# Patient Record
Sex: Female | Born: 1965 | Race: Black or African American | Hispanic: No | Smoking: Never smoker
Health system: Southern US, Community
[De-identification: ages and names within clinical notes are randomized; demographics above are authoritative.]

## PROBLEM LIST (undated history)

## (undated) DIAGNOSIS — J302 Other seasonal allergic rhinitis: Secondary | ICD-10-CM

## (undated) DIAGNOSIS — R87810 Cervical high risk human papillomavirus (HPV) DNA test positive: Secondary | ICD-10-CM

## (undated) DIAGNOSIS — E559 Vitamin D deficiency, unspecified: Secondary | ICD-10-CM

## (undated) DIAGNOSIS — Z9289 Personal history of other medical treatment: Secondary | ICD-10-CM

## (undated) DIAGNOSIS — R87610 Atypical squamous cells of undetermined significance on cytologic smear of cervix (ASC-US): Secondary | ICD-10-CM

## (undated) DIAGNOSIS — N915 Oligomenorrhea, unspecified: Secondary | ICD-10-CM

## (undated) DIAGNOSIS — R61 Generalized hyperhidrosis: Secondary | ICD-10-CM

## (undated) DIAGNOSIS — N951 Menopausal and female climacteric states: Secondary | ICD-10-CM

## (undated) DIAGNOSIS — T7840XA Allergy, unspecified, initial encounter: Secondary | ICD-10-CM

## (undated) DIAGNOSIS — Z3040 Encounter for surveillance of contraceptives, unspecified: Secondary | ICD-10-CM

## (undated) HISTORY — DX: Atypical squamous cells of undetermined significance on cytologic smear of cervix (ASC-US): R87.610

## (undated) HISTORY — DX: Oligomenorrhea, unspecified: N91.5

## (undated) HISTORY — DX: Generalized hyperhidrosis: R61

## (undated) HISTORY — DX: Vitamin D deficiency, unspecified: E55.9

## (undated) HISTORY — DX: Menopausal and female climacteric states: N95.1

## (undated) HISTORY — DX: Cervical high risk human papillomavirus (HPV) DNA test positive: R87.810

## (undated) HISTORY — PX: COLPOSCOPY W/ BIOPSY / CURETTAGE: SUR283

## (undated) HISTORY — DX: Personal history of other medical treatment: Z92.89

## (undated) HISTORY — DX: Other seasonal allergic rhinitis: J30.2

## (undated) HISTORY — DX: Allergy, unspecified, initial encounter: T78.40XA

## (undated) HISTORY — DX: Encounter for surveillance of contraceptives, unspecified: Z30.40

---

## 2004-08-25 ENCOUNTER — Ambulatory Visit (HOSPITAL_COMMUNITY): Admission: RE | Admit: 2004-08-25 | Discharge: 2004-08-25 | Payer: Self-pay | Admitting: Infectious Diseases

## 2006-02-26 ENCOUNTER — Ambulatory Visit: Payer: Self-pay | Admitting: Family Medicine

## 2006-03-03 ENCOUNTER — Ambulatory Visit: Payer: Self-pay | Admitting: Family Medicine

## 2010-04-29 ENCOUNTER — Ambulatory Visit: Payer: Self-pay | Admitting: Family Medicine

## 2012-06-14 ENCOUNTER — Ambulatory Visit: Payer: Self-pay | Admitting: Family Medicine

## 2012-06-14 LAB — HM MAMMOGRAPHY: HM Mammogram: NORMAL

## 2012-06-16 ENCOUNTER — Ambulatory Visit: Payer: Self-pay | Admitting: Family Medicine

## 2014-01-09 LAB — LIPID PANEL
Cholesterol: 187 mg/dL (ref 0–200)
HDL: 64 mg/dL (ref 35–70)
LDL Cholesterol: 90 mg/dL
Triglycerides: 166 mg/dL — AB (ref 40–160)

## 2014-06-25 LAB — HM PAP SMEAR: HM Pap smear: HIGH

## 2015-06-22 ENCOUNTER — Encounter: Payer: Self-pay | Admitting: Family Medicine

## 2015-06-22 DIAGNOSIS — J309 Allergic rhinitis, unspecified: Secondary | ICD-10-CM | POA: Insufficient documentation

## 2015-06-22 DIAGNOSIS — R61 Generalized hyperhidrosis: Secondary | ICD-10-CM | POA: Insufficient documentation

## 2015-06-22 DIAGNOSIS — N915 Oligomenorrhea, unspecified: Secondary | ICD-10-CM | POA: Insufficient documentation

## 2015-06-22 DIAGNOSIS — Z8619 Personal history of other infectious and parasitic diseases: Secondary | ICD-10-CM | POA: Insufficient documentation

## 2015-06-22 DIAGNOSIS — E559 Vitamin D deficiency, unspecified: Secondary | ICD-10-CM | POA: Insufficient documentation

## 2015-06-22 DIAGNOSIS — R7611 Nonspecific reaction to tuberculin skin test without active tuberculosis: Secondary | ICD-10-CM | POA: Insufficient documentation

## 2015-06-28 ENCOUNTER — Encounter: Payer: Self-pay | Admitting: Family Medicine

## 2015-06-28 ENCOUNTER — Ambulatory Visit (INDEPENDENT_AMBULATORY_CARE_PROVIDER_SITE_OTHER): Payer: BC Managed Care – PPO | Admitting: Family Medicine

## 2015-06-28 VITALS — BP 128/86 | HR 82 | Temp 97.9°F | Resp 18 | Ht 65.0 in | Wt 161.8 lb

## 2015-06-28 DIAGNOSIS — Z124 Encounter for screening for malignant neoplasm of cervix: Secondary | ICD-10-CM | POA: Diagnosis not present

## 2015-06-28 DIAGNOSIS — E559 Vitamin D deficiency, unspecified: Secondary | ICD-10-CM | POA: Diagnosis not present

## 2015-06-28 DIAGNOSIS — Z3040 Encounter for surveillance of contraceptives, unspecified: Secondary | ICD-10-CM | POA: Diagnosis not present

## 2015-06-28 DIAGNOSIS — Z114 Encounter for screening for human immunodeficiency virus [HIV]: Secondary | ICD-10-CM | POA: Diagnosis not present

## 2015-06-28 DIAGNOSIS — Z131 Encounter for screening for diabetes mellitus: Secondary | ICD-10-CM

## 2015-06-28 DIAGNOSIS — B977 Papillomavirus as the cause of diseases classified elsewhere: Secondary | ICD-10-CM

## 2015-06-28 DIAGNOSIS — Z0000 Encounter for general adult medical examination without abnormal findings: Secondary | ICD-10-CM | POA: Diagnosis not present

## 2015-06-28 DIAGNOSIS — Z1239 Encounter for other screening for malignant neoplasm of breast: Secondary | ICD-10-CM

## 2015-06-28 DIAGNOSIS — N951 Menopausal and female climacteric states: Secondary | ICD-10-CM

## 2015-06-28 DIAGNOSIS — Z13220 Encounter for screening for lipoid disorders: Secondary | ICD-10-CM | POA: Diagnosis not present

## 2015-06-28 DIAGNOSIS — Z01419 Encounter for gynecological examination (general) (routine) without abnormal findings: Secondary | ICD-10-CM

## 2015-06-28 DIAGNOSIS — Z23 Encounter for immunization: Secondary | ICD-10-CM

## 2015-06-28 MED ORDER — DESOGESTREL-ETHINYL ESTRADIOL 0.15-0.02/0.01 MG (21/5) PO TABS: 1.0000 | ORAL_TABLET | Freq: Every day | ORAL | Status: DC

## 2015-06-28 NOTE — Progress Notes (Signed)
Name: Amber Hardin   MRN: [id number]    DOB: 1966-06-10   Date:06/28/2015       Progress Note  Subjective  Chief Complaint  Chief Complaint  Patient presents with  . Annual Exam    HPI  Well Woman Exam: skipping cycles even on ocp but she does not want stop it at this time, not sexually active for the past 8 months, denies vaginal symptoms, no breast complaints.    Patient Active Problem List   Diagnosis Date Noted  . Allergic rhinitis 06/22/2015  . 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 Nonspecific reaction to tuberculin skin test 06/22/2015  . Infrequent menses 06/22/2015  . Night sweat 06/22/2015  . Vitamin D deficiency 06/22/2015    History reviewed. No pertinent past surgical history.  Family History  Problem Relation Age of Onset  . Cancer Mother 73    Breast  . Diabetes Mother   . Heart disease Father   . Heart attack Father 69  . Diabetes Sister     Middle Sister  . Hypertension Sister     Older Sister  . Thyroid disease Sister     Older Sister    Social History   Social History  . Marital Status: Divorced    Spouse Name: N/A  . Number of Children: N/A  . Years of Education: N/A   Occupational History  . Not on file.   Social History Main Topics  . Smoking status: Never Smoker   . Smokeless tobacco: Never Used  . Alcohol Use: 0.0 oz/week    0 Standard drinks or equivalent per week     Comment: socially  . Drug Use: No  . Sexual Activity: Not Currently   Other Topics Concern  . Not on file   Social History Narrative     Current outpatient prescriptions:  .  desogestrel-ethinyl estradiol (KARIVA) 0.15-0.02/0.01 MG (21/5) tablet, Take 1 tablet by mouth daily., Disp: 1 Package, Rfl: 12 .  fexofenadine (ALLEGRA) 180 MG tablet, Take 1 tablet by mouth daily., Disp: , Rfl:  .  Multiple Vitamins-Minerals (MULTIVITAL) CHEW, Chew 1 tablet by mouth daily., Disp: , Rfl:  .  triamcinolone (NASACORT ALLERGY 24HR) 55 MCG/ACT AERO  nasal inhaler, Place 2 sprays into the nose every morning., Disp: , Rfl:   Allergies  Allergen Reactions  . Latex      ROS  Constitutional: Negative for fever or weight change.  Respiratory: Negative for cough and shortness of breath.   Cardiovascular: Negative for chest pain or palpitations.  Gastrointestinal: Negative for abdominal pain, no bowel changes.  Musculoskeletal: Negative for gait problem or joint swelling.  Skin: Negative for rash.  Neurological: Negative for dizziness or headache.  No other specific complaints in a complete review of systems (except as listed in HPI above).  Objective  Filed Vitals:   06/28/15 0846  BP: 128/86  Pulse: 82  Temp: 97.9 F (36.6 C)  TempSrc: Oral  Resp: 18  Height: 5\' 5"  (1.651 m)  Weight: 161 lb 12.8 oz (73.392 kg)  SpO2: 95%    Body mass index is 26.92 kg/(m^2).  Physical Exam  Constitutional: Patient appears well-developed and well-nourished. No distress.  HENT: Head: Normocephalic and atraumatic. Ears: B TMs ok, no erythema or effusion; Nose: Nose normal. Mouth/Throat: Oropharynx is clear and moist. No oropharyngeal exudate.  Eyes: Conjunctivae and EOM are normal. Pupils are equal, round, and reactive to light. No scleral icterus.  Neck: Normal range of motion. Neck supple.  No JVD present. No thyromegaly present.  Cardiovascular: Normal rate, regular rhythm and normal heart sounds.  No murmur heard. No BLE edema. Pulmonary/Chest: Effort normal and breath sounds normal. No respiratory distress. Abdominal: Soft. Bowel sounds are normal, no distension. There is no tenderness. no masses Breast: no lumps or masses, no nipple discharge or rashes FEMALE GENITALIA:  External genitalia normal External urethra normal Vaginal vault normal without discharge or lesions Cervix normal without discharge or lesions Bimanual exam normal without masses RECTAL: no rectal masses or hemorrhoids Musculoskeletal: Normal range of motion, no  joint effusions. No gross deformities Neurological: he is alert and oriented to person, place, and time. No cranial nerve deficit. Coordination, balance, strength, speech and gait are normal.  Skin: Skin is warm and dry. No rash noted. No erythema.  Psychiatric: Patient has a normal mood and affect. behavior is normal. Judgment and thought content normal.   PHQ2/9: Depression screen PHQ 2/9 06/28/2015  Decreased Interest 0  Down, Depressed, Hopeless 0  PHQ - 2 Score 0    Fall Risk: Fall Risk  06/28/2015  Falls in the past year? No     Assessment & Plan  1. Well woman exam Discussed importance of 150 minutes of physical activity weekly, eat two servings of fish weekly, eat one serving of tree nuts ( cashews, pistachios, pecans, almonds.Marland Kitchen) every other day, eat 6 servings of fruit/vegetables daily and drink plenty of water and avoid sweet beverages.   2. HPV (human papilloma virus) infection  - Pap IG, CT/NG NAA, and HPV (high risk)  3. Vitamin D deficiency Taking otc medication -vitamin D  4. Lipid screening  - Lipid panel  5. Screening for diabetes mellitus (DM)  - Hemoglobin A1c  6. Perimenopause Discussed symptoms, suggested stopping ocp but she wants to continue for now  7. Encounter for surveillance of contraceptives  - desogestrel-ethinyl estradiol (KARIVA) 0.15-0.02/0.01 MG (21/5) tablet; Take 1 tablet by mouth daily.  Dispense: 1 Package; Refill: 12  8. Encounter for screening for HIV  - HIV antibody  9. Breast cancer screening  - MM Digital Screening; Future   10. Needs flu shot  - Flu Vaccine QUAD 36+ mos PF IM (Fluarix Quad PF)

## 2015-06-29 LAB — HEMOGLOBIN A1C
Est. average glucose Bld gHb Est-mCnc: 126 mg/dL
Hgb A1c MFr Bld: 6 % — ABNORMAL HIGH (ref 4.8–5.6)

## 2015-06-29 LAB — VITAMIN D 25 HYDROXY (VIT D DEFICIENCY, FRACTURES): Vit D, 25-Hydroxy: 23.2 ng/mL — ABNORMAL LOW (ref 30.0–100.0)

## 2015-06-29 LAB — HIV ANTIBODY (ROUTINE TESTING W REFLEX): HIV Screen 4th Generation wRfx: NONREACTIVE

## 2015-06-29 LAB — LIPID PANEL
Chol/HDL Ratio: 2.8 ratio units (ref 0.0–4.4)
Cholesterol, Total: 179 mg/dL (ref 100–199)
HDL: 64 mg/dL (ref >39)
LDL Calculated: 75 mg/dL (ref 0–99)
Triglycerides: 202 mg/dL — ABNORMAL HIGH (ref 0–149)
VLDL Cholesterol Cal: 40 mg/dL (ref 5–40)

## 2015-07-01 NOTE — Progress Notes (Signed)
Patient notified

## 2015-07-03 LAB — PAP IG, CT-NG NAA, HPV HIGH-RISK
HPV, HIGH-RISK: NEGATIVE
PAP Smear Comment: 0

## 2015-07-03 NOTE — Progress Notes (Signed)
Patient Pap is getting sent through fax, not sure why it did not cross over.

## 2015-07-04 NOTE — Progress Notes (Signed)
Pt.notified

## 2016-06-29 ENCOUNTER — Encounter: Payer: Self-pay | Admitting: Family Medicine

## 2016-06-29 ENCOUNTER — Other Ambulatory Visit: Payer: Self-pay | Admitting: Family Medicine

## 2016-06-29 ENCOUNTER — Ambulatory Visit (INDEPENDENT_AMBULATORY_CARE_PROVIDER_SITE_OTHER): Payer: BC Managed Care – PPO | Admitting: Family Medicine

## 2016-06-29 VITALS — BP 112/70 | HR 79 | Temp 98.8°F | Resp 18 | Ht 65.0 in | Wt 158.5 lb

## 2016-06-29 DIAGNOSIS — Z124 Encounter for screening for malignant neoplasm of cervix: Secondary | ICD-10-CM

## 2016-06-29 DIAGNOSIS — R350 Frequency of micturition: Secondary | ICD-10-CM | POA: Diagnosis not present

## 2016-06-29 DIAGNOSIS — Z0000 Encounter for general adult medical examination without abnormal findings: Secondary | ICD-10-CM

## 2016-06-29 DIAGNOSIS — E559 Vitamin D deficiency, unspecified: Secondary | ICD-10-CM

## 2016-06-29 DIAGNOSIS — Z131 Encounter for screening for diabetes mellitus: Secondary | ICD-10-CM

## 2016-06-29 DIAGNOSIS — B977 Papillomavirus as the cause of diseases classified elsewhere: Secondary | ICD-10-CM | POA: Diagnosis not present

## 2016-06-29 DIAGNOSIS — Z3040 Encounter for surveillance of contraceptives, unspecified: Secondary | ICD-10-CM

## 2016-06-29 DIAGNOSIS — Z13220 Encounter for screening for lipoid disorders: Secondary | ICD-10-CM

## 2016-06-29 DIAGNOSIS — Z01419 Encounter for gynecological examination (general) (routine) without abnormal findings: Secondary | ICD-10-CM

## 2016-06-29 DIAGNOSIS — Z1239 Encounter for other screening for malignant neoplasm of breast: Secondary | ICD-10-CM | POA: Diagnosis not present

## 2016-06-29 LAB — LIPID PANEL
Cholesterol: 177 mg/dL (ref 125–200)
HDL: 63 mg/dL (ref >=46)
LDL Cholesterol: 91 mg/dL (ref <130)
Total CHOL/HDL Ratio: 2.8 Ratio (ref <=5.0)
Triglycerides: 115 mg/dL (ref <150)
VLDL: 23 mg/dL (ref <30)

## 2016-06-29 LAB — COMPLETE METABOLIC PANEL WITH GFR
ALT: 12 U/L (ref 6–29)
AST: 13 U/L (ref 10–35)
Albumin: 4.1 g/dL (ref 3.6–5.1)
Alkaline Phosphatase: 85 U/L (ref 33–115)
BUN: 8 mg/dL (ref 7–25)
CO2: 27 mmol/L (ref 20–31)
Calcium: 10 mg/dL (ref 8.6–10.2)
Chloride: 101 mmol/L (ref 98–110)
Creat: 0.77 mg/dL (ref 0.50–1.10)
GFR, Est African American: >89 mL/min (ref >=60)
GFR, Est Non African American: >89 mL/min (ref >=60)
Glucose, Bld: 101 mg/dL — ABNORMAL HIGH (ref 65–99)
Potassium: 5.1 mmol/L (ref 3.5–5.3)
Sodium: 136 mmol/L (ref 135–146)
Total Bilirubin: 1.1 mg/dL (ref 0.2–1.2)
Total Protein: 7.2 g/dL (ref 6.1–8.1)

## 2016-06-29 NOTE — Progress Notes (Signed)
Name: Amber Hardin   MRN: [id number]    DOB: 09/06/1966   Date:06/29/2016       Progress Note  Subjective  Chief Complaint  Chief Complaint  Patient presents with  . Annual Exam    HPI  Well Woman: she has not been sexually in the past 8 months, no vaginal discharge, no breast problems. She is no longer on ocp's. States she will use condoms if needed for contraception . Hx of HPV positive, but last pap was normal , negative HPV.  Urinary frequency: she went to Urgent care at the end of June with supra - pubic discomfort, pressure and urinary frequency. She was diagnosed with an UTI and treated with 10 days of Septra, she finished the antibiotics but continues to have urinary frequency and intermittent pressure. She still has nocturia, and also has the urge to use restroom, at times has to pull over to urinate.   Patient Active Problem List   Diagnosis Date Noted  . Allergic rhinitis 06/22/2015  . 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 Nonspecific reaction to tuberculin skin test 06/22/2015  . Vitamin D deficiency 06/22/2015    History reviewed. No pertinent surgical history.  Family History  Problem Relation Age of Onset  . Cancer Mother [id number]    Breast  . Diabetes Mother   . Heart disease Father   . Heart attack Father [id number]  . Diabetes Sister     Middle Sister  . Hypertension Sister     Older Sister  . Thyroid disease Sister     Older Sister    Social History   Social History  . Marital status: Divorced    Spouse name: N/A  . Number of children: N/A  . Years of education: N/A   Occupational History  . Not on file.   Social History Main Topics  . Smoking status: Never Smoker  . Smokeless tobacco: Never Used  . Alcohol use 0.0 oz/week     Comment: socially  . Drug use: No  . Sexual activity: Not Currently   Other Topics Concern  . 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    She works at [employer] and works in [employer]     Current Outpatient Prescriptions:  .  Cholecalciferol (VITAMIN D3 ADULT GUMMIES) 1000 units CHEW, Chew 2 each by mouth daily., Disp: , Rfl:  .  fexofenadine (ALLEGRA) 180 MG tablet, Take 1 tablet by mouth daily., Disp: , Rfl:  .  Multiple Vitamins-Minerals (MULTIVITAL) CHEW, Chew 1 tablet by mouth daily., Disp: , Rfl:   Allergies  Allergen Reactions  . Latex   . Penicillin G Rash     ROS  Constitutional: Negative for fever, positive for mil  weight change.  Respiratory: Negative for cough and shortness of breath.   Cardiovascular: Negative for chest pain or palpitations.  Gastrointestinal: Negative for abdominal pain, no bowel changes.  Musculoskeletal: Negative for gait problem or joint swelling.  Skin: Negative for rash.  Neurological: Negative for dizziness or headache.  No other specific complaints in a complete review of systems (except as listed in HPI above).  Objective  Vitals:   06/29/16 0827  BP: 112/70  Pulse: 79  Resp: 18  Temp: 98.8 F (37.1 C)  TempSrc: Oral  SpO2: 98%  Weight: 158 lb 8 oz (71.9 kg)  Height:  5\' 5"  (1.651 m)    Body mass index is 26.38 kg/m.  Physical Exam  Constitutional: Patient appears well-developed and well-nourished. No distress.  HENT: Head: Normocephalic and atraumatic. Ears: B TMs ok, no erythema or effusion; Nose: Nose normal. Mouth/Throat: Oropharynx is clear and moist. No oropharyngeal exudate.  Eyes: Conjunctivae and EOM are normal. Pupils are equal, round, and reactive to light. No scleral icterus.  Neck: Normal range of motion. Neck supple. No JVD present. No thyromegaly present.  Cardiovascular: Normal rate, regular rhythm and normal heart sounds.  No murmur heard. No BLE edema. Pulmonary/Chest: Effort normal and breath sounds normal. No respiratory distress. Abdominal: Soft. Bowel sounds are normal, no distension. There  is no tenderness. no masses Breast: no lumps or masses, no nipple discharge or rashes FEMALE GENITALIA:  External genitalia normal External urethra normal Vaginal vault normal without discharge or lesions Cervix normal without discharge or lesions Bimanual exam normal without masses RECTAL: not done Musculoskeletal: Normal range of motion, no joint effusions. No gross deformities Neurological: he is alert and oriented to person, place, and time. No cranial nerve deficit. Coordination, balance, strength, speech and gait are normal.  Skin: Skin is warm and dry. No rash noted. No erythema.  Psychiatric: Patient has a normal mood and affect. behavior is normal. Judgment and thought content normal.  PHQ2/9: [facility] 06/29/2016 06/28/2015  Decreased Interest 0 0  Down, Depressed, Hopeless 0 0  PHQ - 2 Score 0 0    Fall Risk: Fall Risk  06/29/2016 06/28/2015  Falls in the past year? No No    Functional Status Survey: Is the patient deaf or have difficulty hearing?: No Does the patient have difficulty seeing, even when wearing glasses/contacts?: No Does the patient have difficulty concentrating, remembering, or making decisions?: No Does the patient have difficulty walking or climbing stairs?: No Does the patient have difficulty dressing or bathing?: No Does the patient have difficulty doing errands alone such as visiting a doctor's office or shopping?: No    Assessment & Plan  1. Well woman exam  Discussed importance of 150 minutes of physical activity weekly, eat two servings of fish weekly, eat one serving of tree nuts ( cashews, pistachios, pecans, almonds.Marland Kitchen.) every other day, eat 6 servings of fruit/vegetables daily and drink plenty of water and avoid sweet beverages.   - COMPLETE METABOLIC PANEL WITH GFR  2. HPV (human papilloma virus) infection  Negative in 2016  3. Vitamin D deficiency  - VITAMIN D 25 Hydroxy (Vit-D Deficiency, Fractures)  4. Lipid  screening  - Lipid panel  5. Screening for diabetes mellitus (DM)  - Hemoglobin A1c  6. Breast cancer screening  - MM Digital Screening; Future  7. Cervical cancer screening  - Pap IG, CT/NG NAA, and HPV (high risk)  8. Urinary frequency  - CULTURE, URINE COMPREHENSIVE

## 2016-06-30 LAB — VITAMIN D 25 HYDROXY (VIT D DEFICIENCY, FRACTURES): Vit D, 25-Hydroxy: 38 ng/mL (ref 30–100)

## 2016-06-30 LAB — PAP IG, CT-NG NAA, HPV HIGH-RISK
Chlamydia Probe Amp: NOT DETECTED
GC Probe Amp: NOT DETECTED
HPV DNA High Risk: NOT DETECTED

## 2016-06-30 LAB — HEMOGLOBIN A1C
Hgb A1c MFr Bld: 5.5 % (ref <5.7)
Mean Plasma Glucose: 111 mg/dL

## 2016-07-02 LAB — CULTURE, URINE COMPREHENSIVE

## 2016-07-05 ENCOUNTER — Other Ambulatory Visit: Payer: Self-pay | Admitting: Family Medicine

## 2016-07-05 MED ORDER — SULFAMETHOXAZOLE-TRIMETHOPRIM 800-160 MG PO TABS: 1.0000 | ORAL_TABLET | Freq: Two times a day (BID) | ORAL | 0 refills | Status: DC

## 2016-09-08 ENCOUNTER — Ambulatory Visit (INDEPENDENT_AMBULATORY_CARE_PROVIDER_SITE_OTHER): Payer: BC Managed Care – PPO | Admitting: Family Medicine

## 2016-09-08 ENCOUNTER — Encounter: Payer: Self-pay | Admitting: Family Medicine

## 2016-09-08 DIAGNOSIS — B379 Candidiasis, unspecified: Secondary | ICD-10-CM

## 2016-09-08 DIAGNOSIS — J209 Acute bronchitis, unspecified: Secondary | ICD-10-CM | POA: Insufficient documentation

## 2016-09-08 DIAGNOSIS — T3695XA Adverse effect of unspecified systemic antibiotic, initial encounter: Secondary | ICD-10-CM | POA: Diagnosis not present

## 2016-09-08 MED ORDER — FLUCONAZOLE 150 MG PO TABS: 150.0000 mg | ORAL_TABLET | Freq: Once | ORAL | 0 refills | Status: AC

## 2016-09-08 MED ORDER — BENZONATATE 200 MG PO CAPS: 200.0000 mg | ORAL_CAPSULE | Freq: Two times a day (BID) | ORAL | 0 refills | Status: DC | PRN

## 2016-09-08 MED ORDER — AZITHROMYCIN 250 MG PO TABS: ORAL_TABLET | ORAL | 0 refills | Status: DC

## 2016-09-08 NOTE — Progress Notes (Signed)
Name: Amber Hardin   MRN: [id number]    DOB: Nov 09, 1966   Date:09/08/2016       Progress Note  Subjective  Chief Complaint  Chief Complaint  Patient presents with  . URI    cough, cogestion, sneezinf, fever 99.8 for 1 month   This patient is followed by Dr. Carlynn Purl, new to me Cough  This is a new problem. The current episode started 1 to 4 weeks ago (3+ weeks ago). The cough is productive of sputum. Associated symptoms include chills, a fever, nasal congestion, rhinorrhea and a sore throat. Treatments tried: Mucinex D. The treatment provided moderate relief. There is no history of bronchitis, COPD or pneumonia.    Past Medical History:  Diagnosis Date  . Allergy    mild  . ASCUS with positive high risk HPV cervical   . Contraceptive surveillance   . 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 Night sweats   . Oligomenorrhea   . Vitamin D deficiency     No past surgical history on file.  Family History  Problem Relation Age of Onset  . Cancer Mother 76    Breast  . Diabetes Mother   . Heart disease Father   . Heart attack Father 86  . Diabetes Sister     Middle Sister  . Hypertension Sister     Older Sister  . Thyroid disease Sister     Older Sister    Social History   Social History  . Marital status: Divorced    Spouse name: N/A  . Number of children: N/A  . Years of education: N/A   Occupational History  . Not on file.   Social History Main Topics  . Smoking status: Never Smoker  . Smokeless tobacco: Never Used  . Alcohol use 0.0 oz/week     Comment: socially  . Drug use: No  . Sexual activity: Not Currently   Other Topics Concern  . 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    She works at [employer] and works in [employer]     Current Outpatient Prescriptions:  .  fexofenadine (ALLEGRA) 180 MG tablet, Take 1 tablet by mouth  daily., Disp: , Rfl:  .  Multiple Vitamins-Minerals (MULTIVITAL) CHEW, Chew 1 tablet by mouth daily., Disp: , Rfl:   Allergies  Allergen Reactions  . Latex   . Penicillin G Rash     Review of Systems  Constitutional: Positive for chills and fever.  HENT: Positive for rhinorrhea and sore throat.   Respiratory: Positive for cough.     Objective  Vitals:   09/08/16 1336  BP: 120/82  Pulse: (!) 104  Resp: 16  Temp: 98.8 F (37.1 C)  TempSrc: Oral  SpO2: 96%  Weight: 158 lb 4.8 oz (71.8 kg)  Height: 5\' 5"  (1.651 m)    Physical Exam  Constitutional: She is oriented to person, place, and time and well-developed, well-nourished, and in no distress.  HENT:  Head: Normocephalic and atraumatic.  Right Ear: Tympanic membrane and ear canal normal.  Left Ear: Tympanic membrane and ear canal normal.  Nose: Right sinus exhibits no maxillary sinus tenderness and no frontal sinus tenderness. Left sinus exhibits no maxillary sinus tenderness and no frontal sinus tenderness.  Mouth/Throat: Posterior oropharyngeal erythema present.  Cardiovascular: Normal rate, regular rhythm, S1 normal, S2  normal and normal heart sounds.   No murmur heard. Pulmonary/Chest: Effort normal and breath sounds normal. She has no wheezes. She has no rhonchi.  Neurological: She is alert and oriented to person, place, and time.  Psychiatric: Mood, memory, affect and judgment normal.  Nursing note and vitals reviewed.    Assessment & Plan  1. Acute bronchitis with symptoms > 10 days Start on antimicrobial therapy along with prescription cough suppressant. - azithromycin (ZITHROMAX) 250 MG tablet; 2 tabs po day 1, then 1 tab po q day x 4 days  Dispense: 6 tablet; Refill: 0 - benzonatate (TESSALON) 200 MG capsule; Take 1 capsule (200 mg total) by mouth 2 (two) times daily as needed for cough.  Dispense: 14 capsule; Refill: 0  2. Antibiotic-induced yeast infection  - fluconazole (DIFLUCAN) 150 MG tablet; Take  1 tablet (150 mg total) by mouth once.  Dispense: 1 tablet; Refill: 0   Amber Hardin A. Faylene KurtzShah [facility] 09/08/2016 1:52 PM

## 2016-09-28 ENCOUNTER — Other Ambulatory Visit: Payer: Self-pay | Admitting: Family Medicine

## 2016-09-28 ENCOUNTER — Telehealth: Payer: Self-pay | Admitting: Family Medicine

## 2016-09-28 MED ORDER — SULFAMETHOXAZOLE-TRIMETHOPRIM 800-160 MG PO TABS: 1.0000 | ORAL_TABLET | Freq: Two times a day (BID) | ORAL | 0 refills | Status: DC

## 2016-09-28 NOTE — Telephone Encounter (Signed)
Sent rx to her pharmacy, but she needs to be seen if it does not control symptoms.

## 2016-09-28 NOTE — Telephone Encounter (Signed)
Pt state she just realized that something was called in for her for her urine back on 07/06/16 and she never got the RX, and she is still having issues with that and would like a new RX sent.

## 2016-09-28 NOTE — Telephone Encounter (Signed)
Do you need to see her first?

## 2016-09-29 NOTE — Telephone Encounter (Signed)
Pt informed

## 2016-11-04 ENCOUNTER — Ambulatory Visit
Admission: RE | Admit: 2016-11-04 | Discharge: 2016-11-04 | Disposition: A | Discharge status: Home / Self Care | Payer: BC Managed Care – PPO | Source: Ambulatory Visit | Attending: Family Medicine | Admitting: Family Medicine

## 2016-11-04 DIAGNOSIS — Z1231 Encounter for screening mammogram for malignant neoplasm of breast: Secondary | ICD-10-CM | POA: Insufficient documentation

## 2016-11-04 DIAGNOSIS — Z1239 Encounter for other screening for malignant neoplasm of breast: Secondary | ICD-10-CM

## 2017-07-02 ENCOUNTER — Ambulatory Visit (INDEPENDENT_AMBULATORY_CARE_PROVIDER_SITE_OTHER): Payer: BC Managed Care – PPO | Admitting: Family Medicine

## 2017-07-02 ENCOUNTER — Encounter: Payer: Self-pay | Admitting: Family Medicine

## 2017-07-02 ENCOUNTER — Other Ambulatory Visit: Payer: Self-pay | Admitting: Family Medicine

## 2017-07-02 VITALS — BP 118/64 | HR 82 | Temp 97.9°F | Resp 16 | Ht 65.0 in | Wt 159.4 lb

## 2017-07-02 DIAGNOSIS — Z23 Encounter for immunization: Secondary | ICD-10-CM

## 2017-07-02 DIAGNOSIS — Z13220 Encounter for screening for lipoid disorders: Secondary | ICD-10-CM | POA: Diagnosis not present

## 2017-07-02 DIAGNOSIS — Z1231 Encounter for screening mammogram for malignant neoplasm of breast: Secondary | ICD-10-CM

## 2017-07-02 DIAGNOSIS — Z01419 Encounter for gynecological examination (general) (routine) without abnormal findings: Secondary | ICD-10-CM

## 2017-07-02 DIAGNOSIS — Z131 Encounter for screening for diabetes mellitus: Secondary | ICD-10-CM | POA: Diagnosis not present

## 2017-07-02 DIAGNOSIS — E559 Vitamin D deficiency, unspecified: Secondary | ICD-10-CM

## 2017-07-02 DIAGNOSIS — Z124 Encounter for screening for malignant neoplasm of cervix: Secondary | ICD-10-CM

## 2017-07-02 DIAGNOSIS — Z1239 Encounter for other screening for malignant neoplasm of breast: Secondary | ICD-10-CM

## 2017-07-02 LAB — CBC WITH DIFFERENTIAL/PLATELET
Basophils Absolute: 0 cells/uL (ref 0–200)
Basophils Relative: 0 %
Eosinophils Absolute: 73 cells/uL (ref 15–500)
Eosinophils Relative: 1 %
HCT: 45.1 % — ABNORMAL HIGH (ref 35.0–45.0)
Hemoglobin: 14.7 g/dL (ref 11.7–15.5)
Lymphocytes Relative: 37 %
Lymphs Abs: 2701 cells/uL (ref 850–3900)
MCH: 27.1 pg (ref 27.0–33.0)
MCHC: 32.6 g/dL (ref 32.0–36.0)
MCV: 83.1 fL (ref 80.0–100.0)
MPV: 11.3 fL (ref 7.5–12.5)
Monocytes Absolute: 511 cells/uL (ref 200–950)
Monocytes Relative: 7 %
Neutro Abs: 4015 cells/uL (ref 1500–7800)
Neutrophils Relative %: 55 %
Platelets: 276 10*3/uL (ref 140–400)
RBC: 5.43 MIL/uL — ABNORMAL HIGH (ref 3.80–5.10)
RDW: 13.7 % (ref 11.0–15.0)
WBC: 7.3 10*3/uL (ref 3.8–10.8)

## 2017-07-02 NOTE — Patient Instructions (Signed)
Preventive Care 40-64 Years, Female Preventive care refers to lifestyle choices and visits with your health care provider that can promote health and wellness. What does preventive care include?  A yearly physical exam. This is also called an annual well check.  Dental exams once or twice a year.  Routine eye exams. Ask your health care provider how often you should have your eyes checked.  Personal lifestyle choices, including: ? Daily care of your teeth and gums. ? Regular physical activity. ? Eating a healthy diet. ? Avoiding tobacco and drug use. ? Limiting alcohol use. ? Practicing safe sex. ? Taking low-dose aspirin daily starting at age 58. ? Taking vitamin and mineral supplements as recommended by your health care provider. What happens during an annual well check? The services and screenings done by your health care provider during your annual well check will depend on your age, overall health, lifestyle risk factors, and family history of disease. Counseling Your health care provider may ask you questions about your:  Alcohol use.  Tobacco use.  Drug use.  Emotional well-being.  Home and relationship well-being.  Sexual activity.  Eating habits.  Work and work Statistician.  Method of birth control.  Menstrual cycle.  Pregnancy history.  Screening You may have the following tests or measurements:  Height, weight, and BMI.  Blood pressure.  Lipid and cholesterol levels. These may be checked every 5 years, or more frequently if you are over 81 years old.  Skin check.  Lung cancer screening. You may have this screening every year starting at age 78 if you have a 30-pack-year history of smoking and currently smoke or have quit within the past 15 years.  Fecal occult blood test (FOBT) of the stool. You may have this test every year starting at age 65.  Flexible sigmoidoscopy or colonoscopy. You may have a sigmoidoscopy every 5 years or a colonoscopy  every 10 years starting at age 30.  Hepatitis C blood test.  Hepatitis B blood test.  Sexually transmitted disease (STD) testing.  Diabetes screening. This is done by checking your blood sugar (glucose) after you have not eaten for a while (fasting). You may have this done every 1-3 years.  Mammogram. This may be done every 1-2 years. Talk to your health care provider about when you should start having regular mammograms. This may depend on whether you have a family history of breast cancer.  BRCA-related cancer screening. This may be done if you have a family history of breast, ovarian, tubal, or peritoneal cancers.  Pelvic exam and Pap test. This may be done every 3 years starting at age 80. Starting at age 36, this may be done every 5 years if you have a Pap test in combination with an HPV test.  Bone density scan. This is done to screen for osteoporosis. You may have this scan if you are at high risk for osteoporosis.  Discuss your test results, treatment options, and if necessary, the need for more tests with your health care provider. Vaccines Your health care provider may recommend certain vaccines, such as:  Influenza vaccine. This is recommended every year.  Tetanus, diphtheria, and acellular pertussis (Tdap, Td) vaccine. You may need a Td booster every 10 years.  Varicella vaccine. You may need this if you have not been vaccinated.  Zoster vaccine. You may need this after age 5.  Measles, mumps, and rubella (MMR) vaccine. You may need at least one dose of MMR if you were born in  1957 or later. You may also need a second dose.  Pneumococcal 13-valent conjugate (PCV13) vaccine. You may need this if you have certain conditions and were not previously vaccinated.  Pneumococcal polysaccharide (PPSV23) vaccine. You may need one or two doses if you smoke cigarettes or if you have certain conditions.  Meningococcal vaccine. You may need this if you have certain  conditions.  Hepatitis A vaccine. You may need this if you have certain conditions or if you travel or work in places where you may be exposed to hepatitis A.  Hepatitis B vaccine. You may need this if you have certain conditions or if you travel or work in places where you may be exposed to hepatitis B.  Haemophilus influenzae type b (Hib) vaccine. You may need this if you have certain conditions.  Talk to your health care provider about which screenings and vaccines you need and how often you need them. This information is not intended to replace advice given to you by your health care provider. Make sure you discuss any questions you have with your health care provider. Document Released: 11/29/2015 Document Revised: 07/22/2016 Document Reviewed: 09/03/2015 Elsevier Interactive Patient Education  2017 [employer].

## 2017-07-02 NOTE — Progress Notes (Signed)
Name: Amber Hardin   MRN: [id number]    DOB: 1965-12-28   Date:07/02/2017       Progress Note  Subjective  Chief Complaint  Chief Complaint  Patient presents with  . Annual Exam    HPI  Well Woman: she is not sexually active, she states she would like to meet someone and date but states it is too much work. No breast lumps. Last pap smear was normal, but previous history of HPV we will recheck pap smear. She is physically active and is eating healthy.    Patient Active Problem List   Diagnosis Date Noted  . Acute bronchitis with symptoms > 10 days 09/08/2016  . Antibiotic-induced yeast infection 09/08/2016  . Allergic rhinitis 06/22/2015  . 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 Nonspecific reaction to tuberculin skin test 06/22/2015  . Vitamin D deficiency 06/22/2015    History reviewed. No pertinent surgical history.  Family History  Problem Relation Age of Onset  . Cancer Mother 35       Breast  . Diabetes Mother   . Breast cancer Mother 40  . Heart disease Father   . Heart attack Father 11  . Diabetes Sister        Middle Sister  . Hypertension Sister        Older Sister  . Thyroid disease Sister        Older Sister  . Breast cancer Maternal Grandmother 72    Social History   Social History  . Marital status: Divorced    Spouse name: N/A  . Number of children: N/A  . Years of education: N/A   Occupational History  . Not on file.   Social History Main Topics  . Smoking status: Never Smoker  . Smokeless tobacco: Never Used  . Alcohol use 0.0 oz/week     Comment: socially  . Drug use: No  . Sexual activity: Not Currently   Other Topics Concern  . 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    She works at [employer] and works in [employer] and independent.      Current Outpatient Prescriptions:  Marland Kitchen  Multiple  Vitamins-Minerals (MULTIVITAL) CHEW, Chew 1 tablet by mouth daily., Disp: , Rfl:  .  fexofenadine (ALLEGRA) 180 MG tablet, Take 1 tablet by mouth daily., Disp: , Rfl:  .  sulfamethoxazole-trimethoprim (BACTRIM DS,SEPTRA DS) 800-160 MG tablet, Take 1 tablet by mouth 2 (two) times daily. (Patient not taking: Reported on 07/02/2017), Disp: 10 tablet, Rfl: 0  Allergies  Allergen Reactions  . Latex   . Penicillin G Rash     ROS  Constitutional: Negative for fever or weight change.  Respiratory: Negative for cough and shortness of breath.   Cardiovascular: Negative for chest pain or palpitations.  Gastrointestinal: Negative for abdominal pain, no bowel changes.  Musculoskeletal: Negative for gait problem or joint swelling.  Skin: Negative for rash.  Neurological: Negative for dizziness or headache.  No other specific complaints in a complete review of systems (except as listed in HPI above).  Objective  Vitals:   07/02/17 0829  BP: 118/64  Pulse: 82  Resp: 16  Temp: 97.9 F (36.6 C)  SpO2: 97%  Weight: 159 lb 6 oz (72.3 kg)  Height: 5\' 5"  (1.651 m)    Body mass index is 26.52 kg/m.  Physical Exam  Constitutional: Patient appears well-developed and well-nourished, slightly overweight. No distress.  HENT: Head: Normocephalic and atraumatic. Ears: B TMs ok, no erythema or effusion; Nose: Nose normal. Mouth/Throat: Oropharynx is clear and moist. No oropharyngeal exudate.  Eyes: Conjunctivae and EOM are normal. Pupils are equal, round, and reactive to light. No scleral icterus.  Neck: Normal range of motion. Neck supple. No JVD present. No thyromegaly present.  Cardiovascular: Normal rate, regular rhythm and normal heart sounds.  No murmur heard. No BLE edema. Pulmonary/Chest: Effort normal and breath sounds normal. No respiratory distress. Abdominal: Soft. Bowel sounds are normal, no distension. There is no tenderness. no masses Breast: no lumps or masses, no nipple discharge or  rashes FEMALE GENITALIA:  External genitalia normal External urethra normal Vaginal vault normal without discharge or lesions Cervix normal without discharge or lesions Bimanual exam normal without masses RECTAL: not done Musculoskeletal: Normal range of motion, no joint effusions. No gross deformities Neurological: he is alert and oriented to person, place, and time. No cranial nerve deficit. Coordination, balance, strength, speech and gait are normal.  Skin: Skin is warm and dry. No rash noted. No erythema.  Psychiatric: Patient has a normal mood and affect. behavior is normal. Judgment and thought content normal.  PHQ2/9: Depression screen [facility] 2/9 07/02/2017 06/29/2016 06/28/2015  Decreased Interest 0 0 0  Down, Depressed, Hopeless 0 0 0  PHQ - 2 Score 0 0 0     Fall Risk: Fall Risk  07/02/2017 06/29/2016 06/28/2015  Falls in the past year? No No No     Functional Status Survey: Is the patient deaf or have difficulty hearing?: No Does the patient have difficulty seeing, even when wearing glasses/contacts?: No Does the patient have difficulty concentrating, remembering, or making decisions?: No Does the patient have difficulty walking or climbing stairs?: No Does the patient have difficulty dressing or bathing?: No Does the patient have difficulty doing errands alone such as visiting a doctor's office or shopping?: No  Current Exercise Habits: Home exercise routine;Structured exercise class, Type of exercise: walking;Other - see comments, Time (Minutes): 60, Frequency (Times/Week): 4, Weekly Exercise (Minutes/Week): 240, Intensity: Moderate    Assessment & Plan  1. Well woman exam  Discussed importance of 150 minutes of physical activity weekly, eat two servings of fish weekly, eat one serving of tree nuts ( cashews, pistachios, pecans, almonds.Marland Kitchen) every other day, eat 6 servings of fruit/vegetables daily and drink plenty of water and avoid sweet beverages.  - COMPLETE METABOLIC  PANEL WITH GFR - CBC with Differential/Platelet  2. Need for Tdap vaccination  - Tdap vaccine greater than or equal to 7yo IM  3. Screening for diabetes mellitus (DM)  - Hemoglobin A1c - Insulin, fasting  4. Breast cancer screening  - MM Digital Screening; Future  5. Vitamin D deficiency  - VITAMIN D 25 Hydroxy (Vit-D Deficiency, Fractures)  6. Lipid screening  - Lipid panel  7. Cervical cancer screening  - Pap IG and HPV (high risk) DNA detection

## 2017-07-03 LAB — HEMOGLOBIN A1C
Hgb A1c MFr Bld: 5.5 % (ref <5.7)
Mean Plasma Glucose: 111 mg/dL

## 2017-07-03 LAB — COMPLETE METABOLIC PANEL WITH GFR
ALT: 9 U/L (ref 6–29)
AST: 11 U/L (ref 10–35)
Albumin: 4.2 g/dL (ref 3.6–5.1)
Alkaline Phosphatase: 77 U/L (ref 33–115)
BUN: 11 mg/dL (ref 7–25)
CO2: 27 mmol/L (ref 20–32)
Calcium: 9.6 mg/dL (ref 8.6–10.2)
Chloride: 103 mmol/L (ref 98–110)
Creat: 0.9 mg/dL (ref 0.50–1.10)
GFR, Est African American: 87 mL/min (ref >=60)
GFR, Est Non African American: 75 mL/min (ref >=60)
Glucose, Bld: 94 mg/dL (ref 65–99)
Potassium: 4.7 mmol/L (ref 3.5–5.3)
Sodium: 137 mmol/L (ref 135–146)
Total Bilirubin: 1 mg/dL (ref 0.2–1.2)
Total Protein: 7.2 g/dL (ref 6.1–8.1)

## 2017-07-03 LAB — LIPID PANEL
Cholesterol: 192 mg/dL (ref <200)
HDL: 61 mg/dL (ref >50)
LDL Cholesterol: 112 mg/dL — ABNORMAL HIGH (ref <100)
Total CHOL/HDL Ratio: 3.1 Ratio (ref <5.0)
Triglycerides: 93 mg/dL (ref <150)
VLDL: 19 mg/dL (ref <30)

## 2017-07-05 LAB — INSULIN, FASTING: Insulin fasting, serum: 6.1 u[IU]/mL (ref 2.0–19.6)

## 2017-07-06 LAB — VITAMIN D 25 HYDROXY (VIT D DEFICIENCY, FRACTURES): Vit D, 25-Hydroxy: 35 ng/mL (ref 30–100)

## 2017-07-06 LAB — PAP IG AND HPV HIGH-RISK: HPV DNA HIGH RISK: NOT DETECTED

## 2017-09-14 ENCOUNTER — Encounter: Payer: Self-pay | Admitting: Family Medicine

## 2017-09-14 ENCOUNTER — Ambulatory Visit (INDEPENDENT_AMBULATORY_CARE_PROVIDER_SITE_OTHER): Payer: BC Managed Care – PPO | Admitting: Family Medicine

## 2017-09-14 VITALS — BP 108/64 | HR 89 | Temp 98.4°F | Resp 18 | Ht 65.0 in | Wt 156.3 lb

## 2017-09-14 DIAGNOSIS — N926 Irregular menstruation, unspecified: Secondary | ICD-10-CM | POA: Diagnosis not present

## 2017-09-14 DIAGNOSIS — N951 Menopausal and female climacteric states: Secondary | ICD-10-CM

## 2017-09-14 DIAGNOSIS — Z803 Family history of malignant neoplasm of breast: Secondary | ICD-10-CM | POA: Diagnosis not present

## 2017-09-14 LAB — TSH: TSH: 0.66 mIU/L

## 2017-09-14 MED ORDER — NORGESTIMATE-ETH ESTRADIOL 0.25-35 MG-MCG PO TABS: 1.0000 | ORAL_TABLET | Freq: Every day | ORAL | 11 refills | Status: DC

## 2017-09-14 NOTE — Progress Notes (Signed)
Name: Amber Hardin   MRN: [id number]    DOB: 1965/12/29   Date:09/14/2017       Progress Note  Subjective  Chief Complaint  Chief Complaint  Patient presents with  . Contraception    Would like to discuss going back on the pill-Kariva due to menstrual cycle due to excessive cramping. abdominal pain and clotting    HPI  Irregular cycles/peri-menopause: she is not sexually active for at least one year, cycles have been irregular for years, initially skipping, went on Minerva Fester previously, stopped medication about one year ago and was regular for a while, however since Summer 2018 , cycles have been shorter than usual, at times having 2 cycles per month ( she forgets to log the dates), she states also has cramping, but had that in the past. No vaginal discharge. She states she has cramping, but not sure how it was before . Discussed pelvic US, but she would like to hold off for now, she will call back if no improvement with birth control  Patient Active Problem List   Diagnosis Date Noted  . Acute bronchitis with symptoms > 10 days 09/08/2016  . Antibiotic-induced yeast infection 09/08/2016  . Allergic rhinitis 06/22/2015  . 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 Nonspecific reaction to tuberculin skin test 06/22/2015  . Vitamin D deficiency 06/22/2015    History reviewed. No pertinent surgical history.  Family History  Problem Relation Age of Onset  . Cancer Mother 73       Breast  . Diabetes Mother   . Breast cancer Mother 68  . Heart disease Father   . Heart attack Father 86  . Diabetes Sister        Middle Sister  . Hypertension Sister        Older Sister  . Thyroid disease Sister        Older Sister  . Breast cancer Maternal Grandmother 72    Social History   Social History  . Marital status: Divorced    Spouse name: N/A  . Number of children: N/A  . Years of education: N/A   Occupational History  . Not on file.   Social History Main Topics  . Smoking  status: Never Smoker  . Smokeless tobacco: Never Used  . Alcohol use 0.0 oz/week     Comment: socially  . Drug use: No  . Sexual activity: Not Currently   Other Topics Concern  . 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    She works at [employer] and works in [employer] and independent.      Current Outpatient Prescriptions:  .  BIOTIN PO, Take 2 each by mouth daily., Disp: , Rfl:  .  Cholecalciferol (VITAMIN D3 GUMMIES ADULT PO), Take 2 each by mouth daily., Disp: , Rfl:  .  Multiple Vitamins-Minerals (MULTIVITAL) CHEW, Chew 1 tablet by mouth daily., Disp: , Rfl:  .  fexofenadine (ALLEGRA) 180 MG tablet, Take 1 tablet by mouth daily., Disp: , Rfl:  .  norgestimate-ethinyl estradiol (ORTHO-CYCLEN, 28,) 0.25-35 MG-MCG tablet, Take 1 tablet by mouth daily., Disp: 1 Package, Rfl: 11  Allergies  Allergen Reactions  . Latex   . Penicillin G Rash     ROS  Ten systems reviewed and is negative except as mentioned in HPI   Objective  Vitals:  09/14/17 1000  BP: 108/64  Pulse: 89  Resp: 18  Temp: 98.4 F (36.9 C)  TempSrc: Oral  SpO2: 98%  Weight: 156 lb 4.8 oz (70.9 kg)  Height: _0  (1.651 m)    Body mass index is 26.01 kg/m.  Physical Exam  Constitutional: Patient appears well-developed and well-nourished. Obese  No distress.  HEENT: head atraumatic, normocephalic, pupils equal and reactive to light, neck supple, throat within normal limits Cardiovascular: Normal rate, regular rhythm and normal heart sounds.  No murmur heard. No BLE edema. Pulmonary/Chest: Effort normal and breath sounds normal. No respiratory distress. Abdominal: Soft.  There is no tenderness. Psychiatric: Patient has a normal mood and affect. behavior is normal. Judgment and thought content normal.  Recent Results (from the past 2160 hour(s))  COMPLETE METABOLIC PANEL WITH  GFR     Status: None   Collection Time: 07/02/17  9:21 AM  Result Value Ref Range   Sodium 137 135 - 146 mmol/L   Potassium 4.7 3.5 - 5.3 mmol/L   Chloride 103 98 - 110 mmol/L   CO2 27 20 - 32 mmol/L    Comment: ** Please note change in reference range(s). **      Glucose, Bld 94 65 - 99 mg/dL   BUN 11 7 - 25 mg/dL   Creat 0.90 0.50 - 1.10 mg/dL   Total Bilirubin 1.0 0.2 - 1.2 mg/dL   Alkaline Phosphatase 77 33 - 115 U/L   AST 11 10 - 35 U/L   ALT 9 6 - 29 U/L   Total Protein 7.2 6.1 - 8.1 g/dL   Albumin 4.2 3.6 - 5.1 g/dL   Calcium 9.6 8.6 - 10.2 mg/dL   GFR, Est African American 87 >=60 mL/min   GFR, Est Non African American 75 >=60 mL/min  CBC with Differential/Platelet     Status: Abnormal   Collection Time: 07/02/17  9:21 AM  Result Value Ref Range   WBC 7.3 3.8 - 10.8 K/uL   RBC 5.43 (H) 3.80 - 5.10 MIL/uL   Hemoglobin 14.7 11.7 - 15.5 g/dL   HCT 45.1 (H) 35.0 - 45.0 %   MCV 83.1 80.0 - 100.0 fL   MCH 27.1 27.0 - 33.0 pg   MCHC 32.6 32.0 - 36.0 g/dL   RDW 13.7 11.0 - 15.0 %   Platelets 276 140 - 400 K/uL   MPV 11.3 7.5 - 12.5 fL   Neutro Abs 4,015 1,500 - 7,800 cells/uL   Lymphs Abs 2,701 850 - 3,900 cells/uL   Monocytes Absolute 511 200 - 950 cells/uL   Eosinophils Absolute 73 15 - 500 cells/uL   Basophils Absolute 0 0 - 200 cells/uL   Neutrophils Relative % 55 %   Lymphocytes Relative 37 %   Monocytes Relative 7 %   Eosinophils Relative 1 %   Basophils Relative 0 %   Smear Review Criteria for review not met   Hemoglobin A1c     Status: None   Collection Time: 07/02/17  9:21 AM  Result Value Ref Range   Hgb A1c MFr Bld 5.5 <5.7 %    Comment:   For the purpose of screening for the presence of diabetes:   <5.7%       Consistent with the absence of diabetes 5.7-6.4 %   Consistent with increased risk for diabetes (prediabetes) >=6.5 %     Consistent with diabetes   This assay result is consistent with a decreased risk of diabetes.   Currently, no  consensus exists regarding use of hemoglobin A1c for diagnosis of diabetes in children.   According to American Diabetes Association (ADA) guidelines, hemoglobin A1c <7.0% represents optimal control in non-pregnant diabetic patients. Different metrics may apply to specific patient populations. Standards of Medical Care in Diabetes (ADA).      Mean Plasma Glucose 111 mg/dL  Insulin, fasting     Status: None   Collection Time: 07/02/17  9:21 AM  Result Value Ref Range   Insulin fasting, serum 6.1 2.0 - 19.6 uIU/mL    Comment:   This insulin assay shows strong cross-reactivity for some insulin analogs (lispro, aspart, and glargine) and much lower cross-reactivity with others (detemir, glulisine).   Stimulated Insulin reference intervals were established using the Siemens Immulite assay. These values are provided for general guidance only.   Lipid panel     Status: Abnormal   Collection Time: 07/02/17  9:21 AM  Result Value Ref Range   Cholesterol 192 <200 mg/dL   Triglycerides 93 <150 mg/dL   HDL 61 >50 mg/dL   Total CHOL/HDL Ratio 3.1 <5.0 Ratio   VLDL 19 <30 mg/dL   LDL Cholesterol 112 (H) <100 mg/dL  VITAMIN D 25 Hydroxy (Vit-D Deficiency, Fractures)     Status: None   Collection Time: 07/02/17  9:21 AM  Result Value Ref Range   Vit D, 25-Hydroxy 35 30 - 100 ng/mL    Comment: Vitamin D Status           25-OH Vitamin D        Deficiency                <20 ng/mL        Insufficiency         20 - 29 ng/mL        Optimal             > or = 30 ng/mL   For 25-OH Vitamin D testing on patients on D2-supplementation and patients for whom quantitation of D2 and D3 fractions is required, the QuestAssureD 25-OH VIT D, (D2,D3), LC/MS/MS is recommended: order code [id number] (patients > 2 yrs).   Pap IG and HPV (high risk) DNA detection     Status: None   Collection Time: 07/02/17  9:44 AM  Result Value Ref Range   HPV DNA High Risk Not Detected     Comment: HIGH RISK HPV types  (16,18,31,33,35,39,45,51,52,56,58,59,66,68) were not detected. Other HPV types which cause anogenital lesions may be present. The significance of the other types of HPV in malignant  processes has not been established.                  ** Normal Reference Range: Not Detected **      HPV High Risk testing performed using the APTIMA HPV mRNA Assay.      Specimen adequacy:      Comment: SATISFACTORY.  Endocervical/transformation zone component present.   FINAL DIAGNOSIS:      Comment: - NEGATIVE FOR INTRAEPITHELIAL LESIONS OR MALIGNANCY.    COMMENTS:      Comment: LMP (Last Menstrual Period) of the patient is not given. This Pap test has been evaluated with computer assisted technology.    Cytotechnologist:      Comment: KS, CT(HEW) *  The Pap is a screening test for cervical cancer. It is not a  diagnostic test and is subject to false negative and false positive  results. It is most reliable when a satisfactory sample, regularly  obtained, is submitted with relevant clinical findings and history,  and when the Pap result is evaluated along with historic and current  clinical information.      PHQ2/9: Depression screen [facility] 2/9 09/14/2017 07/02/2017 06/29/2016 06/28/2015  Decreased Interest 0 0 0 0  Down, Depressed, Hopeless 0 0 0 0  PHQ - 2 Score 0 0 0 0     Fall Risk: Fall Risk  09/14/2017 07/02/2017 06/29/2016 06/28/2015  Falls in the past year? No No No No     Functional Status Survey: Is the patient deaf or have difficulty hearing?: No Does the patient have difficulty seeing, even when wearing glasses/contacts?: No Does the patient have difficulty concentrating, remembering, or making decisions?: No Does the patient have difficulty walking or climbing stairs?: No Does the patient have difficulty dressing or bathing?: No Does the patient have difficulty doing errands alone such as visiting a doctor's office or shopping?: No    Assessment & Plan  1. Irregular  periods/menstrual cycles  - norgestimate-ethinyl estradiol (ORTHO-CYCLEN, 28,) 0.25-35 MG-MCG tablet; Take 1 tablet by mouth daily.  Dispense: 1 Package; Refill: 11 - TSH  Labs from august reviewed, also normal pap smear.   2. Family history of breast cancer  Normal mammograms, she is aware of risk of ocp, no interested on IUD  3. Peri-menopause  Night sweats improved Discussed peri-menopausal symptoms.

## 2017-10-15 ENCOUNTER — Ambulatory Visit: Payer: BC Managed Care – PPO | Admitting: Physician Assistant

## 2017-10-15 VITALS — BP 146/76 | HR 88 | Temp 98.1°F | Resp 16 | Ht 65.0 in | Wt 158.0 lb

## 2017-10-15 DIAGNOSIS — B379 Candidiasis, unspecified: Secondary | ICD-10-CM

## 2017-10-15 DIAGNOSIS — N898 Other specified noninflammatory disorders of vagina: Secondary | ICD-10-CM | POA: Diagnosis not present

## 2017-10-15 LAB — POCT WET + KOH PREP
TRICH BY WET PREP: ABSENT
Yeast by wet prep: ABSENT

## 2017-10-15 MED ORDER — FLUCONAZOLE 150 MG PO TABS: 150.0000 mg | ORAL_TABLET | Freq: Once | ORAL | 0 refills | Status: AC

## 2017-10-15 NOTE — Progress Notes (Signed)
[facility] [address], [place] [phone]  Date:  10/15/2017   Name:  Isaias SakaiMarella L Macari   DOB:  12/08/1965   MRN:  [id number]  PCP:  Alba CorySowles, Krichna, MD    History of Present Illness:  Isaias SakaiMarella L Mcpeek is a 51 y.o. female patient who presents to PCP with  Chief Complaint  Patient presents with  . Vaginal Itching    x 1wk     Feeling irritated and pruritic.  Last night she was itching terribly.  No dysuria, frequency, or hematuria.  No abdominal pain.  No odor.  Discharge, light milky color.   No rash.   She has tried nothing for her symptoms.    Patient Active Problem List   Diagnosis Date Noted  . Acute bronchitis with symptoms > 10 days 09/08/2016  . Antibiotic-induced yeast infection 09/08/2016  . Allergic rhinitis 06/22/2015  . 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 Nonspecific reaction to tuberculin skin test 06/22/2015  . Vitamin D deficiency 06/22/2015    Past Medical History:  Diagnosis Date  . Allergy    mild  . ASCUS with positive high risk HPV cervical   . Contraceptive surveillance   . 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 Night sweats   . Oligomenorrhea   . Vitamin D deficiency     No past surgical history on file.  Social History   Tobacco Use  . Smoking status: Never Smoker  . Smokeless tobacco: Never Used  Substance Use Topics  . Alcohol use: Yes    Alcohol/week: 0.0 oz    Comment: socially  . Drug use: No    Family History  Problem Relation Age of Onset  . Cancer Mother [phone]       Breast  . Diabetes Mother   . Breast cancer Mother [phone]  . Heart disease Father   . Heart attack Father 8050  . Diabetes Sister        Middle Sister  . Hypertension Sister        Older Sister  . Thyroid disease Sister        Older Sister  . Breast cancer Maternal Grandmother 50    Allergies  Allergen Reactions  . Latex   . Penicillin G Rash    Medication list has been reviewed and updated.  Current  Outpatient Medications on File Prior to Visit  Medication Sig Dispense Refill  . BIOTIN PO Take 2 each by mouth daily.    . Cholecalciferol (VITAMIN D3 GUMMIES ADULT PO) Take 2 each by mouth daily.    . fexofenadine (ALLEGRA) 180 MG tablet Take 1 tablet by mouth daily.    . Multiple Vitamins-Minerals (MULTIVITAL) CHEW Chew 1 tablet by mouth daily.    . norgestimate-ethinyl estradiol (ORTHO-CYCLEN, 28,) 0.25-35 MG-MCG tablet Take 1 tablet by mouth daily. 1 Package 11   No current facility-administered medications on file prior to visit.     ROS ROS otherwise unremarkable unless listed above.  Physical Examination: BP (!) 146/76   Pulse 88   Temp 98.1 F (36.7 C) (Oral)   Resp 16   Ht 5\' 5"  (1.651 m)   Wt 158 lb (71.7 kg)   LMP 10/05/2017   SpO2 98%   BMI 26.29 kg/m  Ideal Body Weight: Weight in (lb) to have BMI = 25: 149.9  Physical Exam  Constitutional: She is oriented to person, place, and time. She appears well-developed and well-nourished. No  distress.  HENT:  Head: Normocephalic and atraumatic.  Right Ear: External ear normal.  Left Ear: External ear normal.  Eyes: Conjunctivae and EOM are normal. Pupils are equal, round, and reactive to light.  Cardiovascular: Normal rate.  Pulmonary/Chest: Effort normal. No respiratory distress.  Neurological: She is alert and oriented to person, place, and time.  Skin: She is not diaphoretic.  Psychiatric: She has a normal mood and affect. Her behavior is normal.    Results for orders placed or performed in visit on 10/15/17  POCT Wet + KOH Prep  Result Value Ref Range   Yeast by KOH Present (A) Absent   Yeast by wet prep Absent Absent   WBC by wet prep Few Few   Clue Cells Wet Prep HPF POC Few (A) None   Trich by wet prep Absent Absent   Bacteria Wet Prep HPF POC Many (A) Few   Epithelial Cells By Principal FinancialWet Pref ([facility]) Moderate (A) None, Few, Too numerous to count   RBC,UR,HPF,POC None None RBC/hpf    Assessment and  Plan: Isaias SakaiMarella L Riege is a 51 y.o. female who is here today for cc of  Chief Complaint  Patient presents with  . Vaginal Itching    x 1wk  self obtained wet prep Yeast--treating today Rtc as needed. Vagina itching - Plan: POCT Wet + KOH Prep  Yeast infection - Plan: fluconazole (DIFLUCAN) 150 MG tablet  Trena PlattStephanie Elba Dendinger, PA-C [facility] 12/4/201810:02 AM

## 2017-10-15 NOTE — Patient Instructions (Addendum)
  Vaginal Yeast infection, Adult Vaginal yeast infection is a condition that causes soreness, swelling, and redness (inflammation) of the vagina. It also causes vaginal discharge. This is a common condition. Some women get this infection frequently. What are the causes? This condition is caused by a change in the normal balance of the yeast (candida) and bacteria that live in the vagina. This change causes an overgrowth of yeast, which causes the inflammation. What increases the risk? This condition is more likely to develop in:  Women who take antibiotic medicines.  Women who have diabetes.  Women who take birth control pills.  Women who are pregnant.  Women who douche often.  Women who have a weak defense (immune) system.  Women who have been taking steroid medicines for a long time.  Women who frequently wear tight clothing.  What are the signs or symptoms? Symptoms of this condition include:  White, thick vaginal discharge.  Swelling, itching, redness, and irritation of the vagina. The lips of the vagina (vulva) may be affected as well.  Pain or a burning feeling while urinating.  Pain during sex.  How is this diagnosed? This condition is diagnosed with a medical history and physical exam. This will include a pelvic exam. Your health care provider will examine a sample of your vaginal discharge under a microscope. Your health care provider may send this sample for testing to confirm the diagnosis. How is this treated? This condition is treated with medicine. Medicines may be over-the-counter or prescription. You may be told to use one or more of the following:  Medicine that is taken orally.  Medicine that is applied as a cream.  Medicine that is inserted directly into the vagina (suppository).  Follow these instructions at home:  Take or apply over-the-counter and prescription medicines only as told by your health care provider.  Do not have sex until your  health care provider has approved. Tell your sex partner that you have a yeast infection. That person should go to his or her health care provider if he or she develops symptoms.  Do not wear tight clothes, such as pantyhose or tight pants.  Avoid using tampons until your health care provider approves.  Eat more yogurt. This may help to keep your yeast infection from returning.  Try taking a sitz bath to help with discomfort. This is a warm water bath that is taken while you are sitting down. The water should only come up to your hips and should cover your buttocks. Do this 3-4 times per day or as told by your health care provider.  Do not douche.  Wear breathable, cotton underwear.  If you have diabetes, keep your blood sugar levels under control. Contact a health care provider if:  You have a fever.  Your symptoms go away and then return.  Your symptoms do not get better with treatment.  Your symptoms get worse.  You have new symptoms.  You develop blisters in or around your vagina.  You have blood coming from your vagina and it is not your menstrual period.  You develop pain in your abdomen. This information is not intended to replace advice given to you by your health care provider. Make sure you discuss any questions you have with your health care provider. Document Released: 08/12/2005 Document Revised: 04/15/2016 Document Reviewed: 05/06/2015 Elsevier Interactive Patient Education  2018 Elsevier Inc.   IF you received an x-ray today, you will receive an invoice from [facility]. Please contact [facility] at [phone] with questions or concerns regarding your invoice.   IF you received labwork today, you will receive an invoice from [facility]. Please contact [facility] at [phone] with questions or concerns regarding your invoice.   Our billing staff will not be able to assist you with questions regarding bills from these companies.  You will  be contacted with the lab results as soon as they are available. The fastest way to get your results is to activate your My Chart account. Instructions are located on the last page of this paperwork. If you have not heard from us regarding the results in 2 weeks, please contact this office.     

## 2017-10-19 ENCOUNTER — Encounter: Payer: Self-pay | Admitting: Physician Assistant

## 2017-10-20 ENCOUNTER — Other Ambulatory Visit: Payer: Self-pay | Admitting: Family Medicine

## 2017-10-20 ENCOUNTER — Encounter: Payer: Self-pay | Admitting: Family Medicine

## 2017-11-05 ENCOUNTER — Ambulatory Visit
Admission: RE | Admit: 2017-11-05 | Discharge: 2017-11-05 | Disposition: A | Discharge status: Home / Self Care | Payer: BC Managed Care – PPO | Source: Ambulatory Visit | Attending: Family Medicine | Admitting: Family Medicine

## 2017-11-05 DIAGNOSIS — Z1231 Encounter for screening mammogram for malignant neoplasm of breast: Secondary | ICD-10-CM | POA: Diagnosis not present

## 2017-11-05 DIAGNOSIS — Z1239 Encounter for other screening for malignant neoplasm of breast: Secondary | ICD-10-CM

## 2017-11-10 ENCOUNTER — Other Ambulatory Visit: Payer: Self-pay | Admitting: Family Medicine

## 2017-11-10 DIAGNOSIS — N6489 Other specified disorders of breast: Secondary | ICD-10-CM

## 2017-11-10 DIAGNOSIS — R928 Other abnormal and inconclusive findings on diagnostic imaging of breast: Secondary | ICD-10-CM

## 2017-11-10 DIAGNOSIS — R921 Mammographic calcification found on diagnostic imaging of breast: Secondary | ICD-10-CM

## 2017-11-19 ENCOUNTER — Ambulatory Visit
Admission: RE | Admit: 2017-11-19 | Discharge: 2017-11-19 | Disposition: A | Discharge status: Home / Self Care | Payer: BC Managed Care – PPO | Source: Ambulatory Visit | Attending: Family Medicine | Admitting: Family Medicine

## 2017-11-19 DIAGNOSIS — R928 Other abnormal and inconclusive findings on diagnostic imaging of breast: Secondary | ICD-10-CM

## 2017-11-19 DIAGNOSIS — R921 Mammographic calcification found on diagnostic imaging of breast: Secondary | ICD-10-CM | POA: Diagnosis not present

## 2017-11-19 DIAGNOSIS — N6489 Other specified disorders of breast: Secondary | ICD-10-CM

## 2017-12-16 ENCOUNTER — Ambulatory Visit: Payer: BC Managed Care – PPO | Admitting: Family Medicine

## 2017-12-16 ENCOUNTER — Encounter: Payer: Self-pay | Admitting: Family Medicine

## 2017-12-16 ENCOUNTER — Telehealth: Payer: Self-pay | Admitting: Obstetrics and Gynecology

## 2017-12-16 VITALS — BP 126/82 | HR 86 | Temp 98.2°F | Resp 18 | Ht 65.0 in | Wt 159.7 lb

## 2017-12-16 DIAGNOSIS — N926 Irregular menstruation, unspecified: Secondary | ICD-10-CM

## 2017-12-16 DIAGNOSIS — B373 Candidiasis of vulva and vagina: Secondary | ICD-10-CM | POA: Diagnosis not present

## 2017-12-16 DIAGNOSIS — N924 Excessive bleeding in the premenopausal period: Secondary | ICD-10-CM | POA: Diagnosis not present

## 2017-12-16 DIAGNOSIS — B3731 Acute candidiasis of vulva and vagina: Secondary | ICD-10-CM

## 2017-12-16 MED ORDER — FLUCONAZOLE 150 MG PO TABS: 150.0000 mg | ORAL_TABLET | ORAL | 0 refills | Status: DC

## 2017-12-16 NOTE — Progress Notes (Signed)
Name: Amber Hardin   MRN: [id number]    DOB: 09/27/1966   Date:12/16/2017       Progress Note  Subjective  Chief Complaint  Chief Complaint  Patient presents with  . Vaginitis    Onset-October 15, 2017 after menses she will have yeast infections. Having 2 periods every month and had to go back to Urgent Care on Sunday because having vaginal discharge-white cotton cheese and was given diflucan. But still is itchy and sensitive on the outside.     HPI  Perimenopausal menorrhagia: she states cycles used to be every 28 days and light, but over the past 6 months cycles have been every 21 days and heavy, she is getting nauseated, fatigued, dysmenorrhea first day. She also has noticed yeast vaginitis after each cycle. She is tired of it. Cannot tolerate ocp's. She has read about IUD and is willing to see GYN. Seen by urgent care 12/12/2017, cycle was 12/05/2017 and diagnosed with yeast vaginitis. She is still having some external itching and discomfort, only took one dose of diflucan, no longer having the cottage cheese discharge.   Patient Active Problem List   Diagnosis Date Noted  . Allergic rhinitis 06/22/2015  . 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 Nonspecific reaction to tuberculin skin test 06/22/2015  . Vitamin D deficiency 06/22/2015    Social History   Tobacco Use  . Smoking status: Never Smoker  . Smokeless tobacco: Never Used  Substance Use Topics  . Alcohol use: Yes    Alcohol/week: 0.0 oz    Comment: socially     Current Outpatient Medications:  .  BIOTIN PO, Take 2 each by mouth daily., Disp: , Rfl:  .  Cholecalciferol (VITAMIN D3 GUMMIES ADULT PO), Take 2 each by mouth daily., Disp: , Rfl:  .  fexofenadine (ALLEGRA) 180 MG tablet, Take 1 tablet by mouth daily., Disp: , Rfl:  .  fluconazole (DIFLUCAN) 150 MG tablet, , Disp: , Rfl:  .  Multiple Vitamins-Minerals (MULTIVITAL) CHEW, Chew 1 tablet by mouth daily., Disp: , Rfl:   Allergies  Allergen Reactions   . Latex   . Penicillin G Rash    ROS  Ten systems reviewed and is negative except as mentioned in HPI   Objective  Vitals:   12/16/17 1133  BP: 126/82  Pulse: 86  Resp: 18  Temp: 98.2 F (36.8 C)  TempSrc: Oral  SpO2: 96%  Weight: 159 lb 11.2 oz (72.4 kg)  Height: 5\' 5"  (1.651 m)    Body mass index is 26.58 kg/m.    Physical Exam   Constitutional: Patient appears well-developed and well-nourished.  No distress.  HEENT: head atraumatic, normocephalic, pupils equal and reactive to light,  neck supple, throat within normal limits Cardiovascular: Normal rate, regular rhythm and normal heart sounds.  No murmur heard. No BLE edema. Pulmonary/Chest: Effort normal and breath sounds normal. No respiratory distress. Abdominal: Soft.  There is no tenderness. GYN: mild swelling of vulva, no erythema or rashes, normal vaginal exam, very mild discharge  Psychiatric: Patient has a normal mood and affect. behavior is normal. Judgment and thought content normal.  Recent Results (from the past 2160 hour(s))  POCT Wet + KOH Prep     Status: Abnormal   Collection Time: 10/15/17  4:52 PM  Result Value Ref Range   Yeast by KOH Present (A) Absent   Yeast by wet prep Absent Absent   WBC by wet prep Few Few   Clue Cells Wet Prep  HPF POC Few (A) None   Trich by wet prep Absent Absent   Bacteria Wet Prep HPF POC Many (A) Few   Epithelial Cells By [employer] Pref ([facility]) Moderate (A) None, Few, Too numerous to count   RBC,UR,HPF,POC None None RBC/hpf     Assessment & Plan  1. Preclimacteric menorrhagia  Having cycles q 21 days and heavy, she is tired, cannot tolerate ocp, she is willing to try IUD  - Ambulatory referral to Obstetrics / Gynecology  3. Yeast vaginitis  - fluconazole (DIFLUCAN) 150 MG tablet; Take 1 tablet (150 mg total) by mouth every other day. For 3 days prn  Dispense: 12 tablet; Refill: 0

## 2017-12-16 NOTE — Telephone Encounter (Signed)
[facility]

## 2017-12-27 ENCOUNTER — Ambulatory Visit (INDEPENDENT_AMBULATORY_CARE_PROVIDER_SITE_OTHER): Payer: BC Managed Care – PPO | Admitting: Obstetrics and Gynecology

## 2017-12-27 ENCOUNTER — Encounter: Payer: Self-pay | Admitting: Obstetrics and Gynecology

## 2017-12-27 VITALS — BP 120/80 | HR 64 | Ht 64.0 in | Wt 159.0 lb

## 2017-12-27 DIAGNOSIS — Z30014 Encounter for initial prescription of intrauterine contraceptive device: Secondary | ICD-10-CM | POA: Diagnosis not present

## 2017-12-27 DIAGNOSIS — B3731 Acute candidiasis of vulva and vagina: Secondary | ICD-10-CM

## 2017-12-27 DIAGNOSIS — B373 Candidiasis of vulva and vagina: Secondary | ICD-10-CM

## 2017-12-27 MED ORDER — MISOPROSTOL 100 MCG PO TABS: 100.0000 ug | ORAL_TABLET | Freq: Once | ORAL | 0 refills | Status: DC

## 2017-12-27 NOTE — Progress Notes (Signed)
Chief Complaint  Patient presents with  . Contraception    HPI:      Ms. Amber SakaiMarella L Hardin is a 52 y.o. G1P0010 who LMP was Patient's last menstrual period was 12/05/2017., presents today for NP eval for [facility]kyleena for heavy periods with cramping, fatigue, nausea. Menses are Q21 days (used to be Q28 days), last 3-4 days, medium heavy flow, with dime sized clots. No BTB. Takes BC powder for dysmen. Pt prefers kyleena due to lawsuits with Mirena. Can't tolerate OCPs due to frequent yeast vag sx before and after menses, although still has frequent yeast infections without BC. Treats with diflucan. Had sx yesterday, took diflucan, no sx today. Uses lever soap, no dryer sheets.   Annual with PCP, last pap 8/18 WNL. Pt not sex active.  Past Medical History:  Diagnosis Date  . Allergy    mild  . ASCUS with positive high risk HPV cervical   . Contraceptive surveillance   . 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 Night sweats   . Oligomenorrhea   . Vitamin D deficiency     History reviewed. No pertinent surgical history.  Family History  Problem Relation Age of Onset  . Diabetes Mother   . Breast cancer Mother [id number]  . Heart disease Father   . Heart attack Father 1150  . Diabetes Sister        Middle Sister  . Hypertension Sister        Older Sister  . Thyroid disease Sister        Older Sister  . Breast cancer Maternal Grandmother [id number]    Social History   Socioeconomic History  . Marital status: Divorced    Spouse name: Not on file  . Number of children: Not on file  . Years of education: Not on file  . Highest education level: Not on file  Social Needs  . Financial resource strain: Not on file  . Food insecurity - worry: Not on file  . Food insecurity - inability: Not on file  . Transportation needs - medical: Not on file  . Transportation needs - non-medical: Not on file  Occupational History  . Not on file  Tobacco Use  . Smoking status: Never Smoker  . Smokeless  tobacco: Never Used  Substance and Sexual Activity  . Alcohol use: Yes    Alcohol/week: 0.0 oz    Comment: socially  . Drug use: No  . Sexual activity: Not Currently  Other Topics Concern  . 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    She works at [employer] and works in [employer]   Single and independent.      Current Outpatient Medications:  .  BIOTIN PO, Take 2 each by mouth daily., Disp: , Rfl:  .  Cholecalciferol (VITAMIN D3 GUMMIES ADULT PO), Take 2 each by mouth daily., Disp: , Rfl:  .  fexofenadine (ALLEGRA) 180 MG tablet, Take 1 tablet by mouth daily., Disp: , Rfl:  .  fluconazole (DIFLUCAN) 150 MG tablet, Take 1 tablet (150 mg total) by mouth every other day. For 3 days prn, Disp: 12 tablet, Rfl: 0 .  misoprostol (CYTOTEC) 100 MCG tablet, Take 1 tablet (100 mcg total) by mouth once for 1 dose. 1 hour before appt, Disp: 1 tablet, Rfl: 0 .  Multiple Vitamins-Minerals (MULTIVITAL) CHEW, Chew  1 tablet by mouth daily., Disp: , Rfl:    ROS:  Review of Systems  Constitutional: Negative for fatigue, fever and unexpected weight change.  Respiratory: Negative for cough, shortness of breath and wheezing.   Cardiovascular: Negative for chest pain, palpitations and leg swelling.  Gastrointestinal: Negative for blood in stool, constipation, diarrhea, nausea and vomiting.  Endocrine: Negative for cold intolerance, heat intolerance and polyuria.  Genitourinary: Positive for vaginal discharge. Negative for dyspareunia, dysuria, flank pain, frequency, genital sores, hematuria, menstrual problem, pelvic pain, urgency, vaginal bleeding and vaginal pain.  Musculoskeletal: Negative for back pain, joint swelling and myalgias.  Skin: Negative for rash.  Neurological: Negative for dizziness, syncope, light-headedness, numbness and headaches.  Hematological: Negative for adenopathy.    Psychiatric/Behavioral: Negative for agitation, confusion, sleep disturbance and suicidal ideas. The patient is not nervous/anxious.      OBJECTIVE:   Vitals:  BP 120/80   Pulse 64   Ht 5\' 4"  (1.626 m)   Wt 159 lb (72.1 kg)   LMP 12/05/2017   BMI 27.29 kg/m   Physical Exam  Constitutional: She is oriented to person, place, and time and well-developed, well-nourished, and in no distress. Vital signs are normal.  Genitourinary: Vagina normal, uterus normal, cervix normal, right adnexa normal, left adnexa normal and vulva normal. Uterus is not enlarged. Cervix exhibits no motion tenderness and no tenderness. Right adnexum displays no mass and no tenderness. Left adnexum displays no mass and no tenderness. Vulva exhibits no erythema, no exudate, no lesion, no rash and no tenderness. Vagina exhibits no lesion.  Neurological: She is alert and oriented to person, place, and time.  Psychiatric: Memory, affect and judgment normal.  Vitals reviewed.   Assessment/Plan: Encounter for initial prescription of intrauterine contraceptive device (IUD) - Pros/cons Mirena/[name] discussed. Will see if uterus large enough for Mirena to give better sx control for pt. IUD insertion attempted today but failed.  Due to start menses today, but no bleeding yet. Has never had vag delivery/full pregnancy. RTO with menses/Rx cytotec and NSAIDs 1 hr before appt.   Candidal vaginitis Use dove sens skin soap to see if helps with recurrent yeast vag sx. May benefit from terazol Rx, too, instead of diflucan, due to recurrent sx. No sx today.  Meds ordered this encounter  Medications  . misoprostol (CYTOTEC) 100 MCG tablet    Sig: Take 1 tablet (100 mcg total) by mouth once for 1 dose. 1 hour before appt    Dispense:  1 tablet    Refill:  0      Return in about 2 days (around 12/29/2017) for IUD insertion with menses.  Kosta Schnitzler B. Earma Nicolaou, PA-C 12/27/2017 9:55 AM

## 2017-12-27 NOTE — Patient Instructions (Signed)
I value your feedback and entrusting us with your care. If you get a [place] patient survey, I would appreciate you taking the time to let us know about your experience today. Thank you! 

## 2017-12-28 NOTE — Telephone Encounter (Signed)
Pt is schedule 12/29/17 for kyleena insertion

## 2017-12-29 ENCOUNTER — Encounter: Payer: Self-pay | Admitting: Obstetrics and Gynecology

## 2017-12-29 ENCOUNTER — Ambulatory Visit (INDEPENDENT_AMBULATORY_CARE_PROVIDER_SITE_OTHER): Payer: BC Managed Care – PPO | Admitting: Obstetrics and Gynecology

## 2017-12-29 VITALS — BP 118/74 | Ht 64.0 in | Wt 161.0 lb

## 2017-12-29 DIAGNOSIS — Z30430 Encounter for insertion of intrauterine contraceptive device: Secondary | ICD-10-CM | POA: Diagnosis not present

## 2017-12-29 DIAGNOSIS — N921 Excessive and frequent menstruation with irregular cycle: Secondary | ICD-10-CM

## 2017-12-29 MED ORDER — LEVONORGESTREL 20 MCG/24HR IU IUD: 1.0000 | INTRAUTERINE_SYSTEM | Freq: Once | INTRAUTERINE | 0 refills | Status: AC

## 2017-12-29 NOTE — Progress Notes (Signed)
   Chief Complaint  Patient presents with  . Contraception     IUD PROCEDURE NOTE:  Amber Hardin is a 52 y.o. G1P0010 here for Mirena  IUD insertion for menometrorrhagia. Discussed [name] vs Mirena since pt never had full pregnancy. Pt would have better bleeding control with Mirena over LingleKyleena, and pt is amenable to Mirena if uterus sounds large enough. Last pap smear was normal.  BP 118/74   Ht 5\' 4"  (1.626 m)   Wt 161 lb (73 kg)   LMP 12/27/2017   BMI 27.64 kg/m   IUD Insertion Procedure Note Patient identified, informed consent performed, consent signed.   Discussed risks of irregular bleeding, cramping, infection, malpositioning or misplacement of the IUD outside the uterus which may require further procedure such as laparoscopy, risk of failure <1%. Time out was performed.    Speculum placed in the vagina.  Cervix visualized.  Cleaned with Betadine x 2.  Grasped anteriorly with a single tooth tenaculum.  Uterus sounded to 7.5 cm.   IUD placed per manufacturer's recommendations.  Strings trimmed to 3 cm. Tenaculum was removed, good hemostasis noted.  Patient tolerated procedure well.   ASSESSMENT:  Encounter for insertion of intrauterine contraceptive device (IUD) - Mirena insertion today. RTO in 4 wks for f/u. - Plan: levonorgestrel (MIRENA) 20 MCG/24HR IUD  Menometrorrhagia - Try IUD for sx. F/u prn.  - Plan: levonorgestrel (MIRENA) 20 MCG/24HR IUD   Meds ordered this encounter  Medications  . levonorgestrel (MIRENA) 20 MCG/24HR IUD    Sig: 1 Intra Uterine Device (1 each total) by Intrauterine route once for 1 dose.    Dispense:  1 each    Refill:  0    Order Specific Question:   Supervising Provider    Answer:   Nadara MustardHARRIS, ROBERT P [[id number]]     Plan:  Patient was given post-procedure instructions.  She was advised to have backup contraception for one week.   Call if you are having increasing pain, cramps or bleeding or if you have a fever greater than 100.4  degrees F., shaking chills, nausea or vomiting. Patient was also asked to check IUD strings periodically and follow up in 4 weeks for IUD check.  Return in about 4 weeks (around 01/26/2018) for IUD f/u.  Mechille Varghese B. Lorrie Strauch, PA-C 12/29/2017 3:00 PM

## 2017-12-29 NOTE — Patient Instructions (Addendum)
I value your feedback and entrusting us with your care. If you get a [place] patient survey, I would appreciate you taking the time to let us know about your experience today. Thank you!  [facility] [phone]  Instructions after IUD insertion  Most women experience no significant problems after insertion of an IUD, however minor cramping and spotting for a few days is common. Cramps may be treated with ibuprofen 800mg every 8 hours or Tylenol 650 mg every 4 hours. Contact [facility] immediately if you experience any of the following symptoms during the next week: temperature >99.6 degrees, worsening pelvic pain, abdominal pain, fainting, unusually heavy vaginal bleeding, foul vaginal discharge, or if you think you have expelled the IUD.  Nothing inserted in the vagina for 48 hours. You will be scheduled for a follow up visit in approximately four weeks.  You should check monthly to be sure you can feel the IUD strings in the upper vagina. If you are having a monthly period, try to check after each period. If you cannot feel the IUD strings,  contact [facility] immediately so we can do an exam to determine if the IUD has been expelled.   Please use backup protection until we can confirm the IUD is in place.  Call [facility] if you are exposed to or diagnosed with a sexually transmitted infection, as we will need to discuss whether it is safe for you to continue using an IUD.   

## 2018-01-11 NOTE — Telephone Encounter (Signed)
Amber Hardin received per 12/29/17 visit note.

## 2018-01-27 ENCOUNTER — Encounter: Payer: Self-pay | Admitting: Obstetrics and Gynecology

## 2018-01-27 ENCOUNTER — Other Ambulatory Visit: Payer: Self-pay

## 2018-01-27 ENCOUNTER — Ambulatory Visit: Payer: BC Managed Care – PPO | Admitting: Obstetrics and Gynecology

## 2018-01-27 VITALS — BP 112/72 | HR 76 | Ht 64.0 in | Wt 163.0 lb

## 2018-01-27 DIAGNOSIS — Z30431 Encounter for routine checking of intrauterine contraceptive device: Secondary | ICD-10-CM

## 2018-01-27 DIAGNOSIS — T8332XA Displacement of intrauterine contraceptive device, initial encounter: Secondary | ICD-10-CM

## 2018-01-27 NOTE — Addendum Note (Signed)
Addended by: Althea GrimmerOPLAND, Davonta Stroot B on: 01/27/2018 04:42 PM   Modules accepted: Orders

## 2018-01-27 NOTE — Progress Notes (Addendum)
   Chief Complaint  Patient presents with  . Follow-up    IUD String Check/Still spotting & Cramping/LBP     History of Present Illness:  Amber Hardin is a 52 y.o. that had a Mirena IUD placed approximately 1 month ago. Since that time, she has had  pelvic cramping, non-menstrual bleeding, and a normal period. Cramping is improving but pt still taking advil 400 mg daily (has been able to decrease dose). Pt had IUD placed for menorrhagia-- bleeding still heavy LMP, cramping worse. Pt is not sex active.   Review of Systems  Constitutional: Negative for fever.  Gastrointestinal: Negative for blood in stool, constipation, diarrhea, nausea and vomiting.  Genitourinary: Positive for pelvic pain and vaginal bleeding. Negative for dyspareunia, dysuria, flank pain, frequency, hematuria, urgency, vaginal discharge and vaginal pain.  Musculoskeletal: Negative for back pain.  Skin: Negative for rash.    Physical Exam:  BP 112/72 (BP Location: Left Arm, Patient Position: Sitting, Cuff Size: Normal)   Pulse 76   Ht 5\' 4"  (1.626 m)   Wt 163 lb (73.9 kg)   LMP 01/18/2018   BMI 27.98 kg/m  Body mass index is 27.98 kg/m.  Pelvic exam:  Two IUD strings absent from the cervical os. EGBUS, vaginal vault and cervix: within normal limits   Assessment:   Encounter for routine checking of intrauterine contraceptive device (IUD) - Still having cramping/spotting. Reassurance. F/u if sx persist in 3-4  months. Check u/s anyway.  - Plan: US PELVIS TRANSVANGINAL NON-OB (TV ONLY)  Intrauterine contraceptive device threads lost, initial encounter - Check GYN u/s for IUD placement. Will call pt with results.  - Plan: US PELVIS TRANSVANGINAL NON-OB (TV ONLY)   Plan: Return in about 1 day (around 01/28/2018) for GYN u/s for IUD placement--[facility] to call pt.   Elison Worrel B. Trany Chernick, PA-C 01/27/2018 4:42 PM

## 2018-01-27 NOTE — Patient Instructions (Signed)
I value your feedback and entrusting us with your care. If you get a  patient survey, I would appreciate you taking the time to let us know about your experience today. Thank you! 

## 2018-01-28 ENCOUNTER — Ambulatory Visit (INDEPENDENT_AMBULATORY_CARE_PROVIDER_SITE_OTHER): Payer: BC Managed Care – PPO

## 2018-01-28 DIAGNOSIS — Z30431 Encounter for routine checking of intrauterine contraceptive device: Secondary | ICD-10-CM | POA: Diagnosis not present

## 2018-01-28 DIAGNOSIS — T8332XA Displacement of intrauterine contraceptive device, initial encounter: Secondary | ICD-10-CM

## 2018-01-31 ENCOUNTER — Telehealth: Payer: Self-pay | Admitting: Obstetrics and Gynecology

## 2018-01-31 NOTE — Telephone Encounter (Signed)
-----   Message from Nadara Mustardobert P Harris, MD sent at 01/31/2018 10:08 AM EDT ----- I would give it another month.  Not sure what right side means as far as its placement, cant see anything on US of concern.  If persists w pain then consider removal and replacement.  ----- Message ----- From: Rica Recordsopland, Alicia B, PA-C Sent: 01/31/2018   9:17 AM To: Nadara Mustardobert P Harris, MD  Can you pls look at u/s for dispo? Had IUD placed 2/19 due to menorrhagia. G0P0. Still having pelvic cramping and spotting. IUD slightly to right. Is this cause of concern vs give sx more time to resolve, as standard with IUD? Thx.

## 2018-01-31 NOTE — Telephone Encounter (Signed)
Pt aware of IUD in EM. F/u via phone in 2-3 months re: sx. Pt agrees.

## 2018-02-23 ENCOUNTER — Encounter: Payer: Self-pay | Admitting: Physician Assistant

## 2018-07-05 ENCOUNTER — Other Ambulatory Visit (HOSPITAL_COMMUNITY)
Admission: RE | Admit: 2018-07-05 | Discharge: 2018-07-05 | Disposition: A | Discharge status: Home / Self Care | Payer: BC Managed Care – PPO | Source: Ambulatory Visit | Attending: Family Medicine | Admitting: Family Medicine

## 2018-07-05 ENCOUNTER — Encounter: Payer: Self-pay | Admitting: Family Medicine

## 2018-07-05 ENCOUNTER — Ambulatory Visit (INDEPENDENT_AMBULATORY_CARE_PROVIDER_SITE_OTHER): Payer: BC Managed Care – PPO | Admitting: Family Medicine

## 2018-07-05 VITALS — BP 110/66 | HR 75 | Temp 98.4°F | Resp 16 | Ht 64.0 in | Wt 161.5 lb

## 2018-07-05 DIAGNOSIS — N924 Excessive bleeding in the premenopausal period: Secondary | ICD-10-CM

## 2018-07-05 DIAGNOSIS — Z1211 Encounter for screening for malignant neoplasm of colon: Secondary | ICD-10-CM | POA: Diagnosis not present

## 2018-07-05 DIAGNOSIS — Z1231 Encounter for screening mammogram for malignant neoplasm of breast: Secondary | ICD-10-CM

## 2018-07-05 DIAGNOSIS — Z113 Encounter for screening for infections with a predominantly sexual mode of transmission: Secondary | ICD-10-CM | POA: Insufficient documentation

## 2018-07-05 DIAGNOSIS — E559 Vitamin D deficiency, unspecified: Secondary | ICD-10-CM

## 2018-07-05 DIAGNOSIS — Z01419 Encounter for gynecological examination (general) (routine) without abnormal findings: Secondary | ICD-10-CM

## 2018-07-05 DIAGNOSIS — B373 Candidiasis of vulva and vagina: Secondary | ICD-10-CM

## 2018-07-05 DIAGNOSIS — Z124 Encounter for screening for malignant neoplasm of cervix: Secondary | ICD-10-CM

## 2018-07-05 DIAGNOSIS — Z13220 Encounter for screening for lipoid disorders: Secondary | ICD-10-CM

## 2018-07-05 DIAGNOSIS — B3731 Acute candidiasis of vulva and vagina: Secondary | ICD-10-CM

## 2018-07-05 DIAGNOSIS — Z1239 Encounter for other screening for malignant neoplasm of breast: Secondary | ICD-10-CM

## 2018-07-05 DIAGNOSIS — Z131 Encounter for screening for diabetes mellitus: Secondary | ICD-10-CM

## 2018-07-05 MED ORDER — FLUCONAZOLE 150 MG PO TABS: 150.0000 mg | ORAL_TABLET | ORAL | 0 refills | Status: DC

## 2018-07-05 NOTE — Progress Notes (Signed)
Name: Amber Hardin   MRN: [id number]    DOB: 24-Jan-1966   Date:07/05/2018       Progress Note  Subjective  Chief Complaint  Chief Complaint  Patient presents with  . Annual Exam    HPI   Patient presents for annual CPE   Diet: FODMAP Exercise: doing orange theory twice a week   USPSTF grade A and B recommendations    Office Visit from 07/05/2018 in [facility]  AUDIT-C Score  1     Depression:  Depression screen [facility] 2/9 07/05/2018 12/16/2017 10/15/2017 09/14/2017 07/02/2017  Decreased Interest 0 0 0 0 0  Down, Depressed, Hopeless 0 0 0 0 0  PHQ - 2 Score 0 0 0 0 0   Hypertension: BP Readings from Last 3 Encounters:  07/05/18 110/66  01/27/18 112/72  12/29/17 118/74   Obesity: Wt Readings from Last 3 Encounters:  07/05/18 161 lb 8 oz (73.3 kg)  01/27/18 163 lb (73.9 kg)  12/29/17 161 lb (73 kg)   BMI Readings from Last 3 Encounters:  07/05/18 27.72 kg/m  01/27/18 27.98 kg/m  12/29/17 27.64 kg/m     STD testing and prevention (HIV/chl/gon/syphilis): today  Intimate partner violence: negative screen  Sexual History/Pain during Intercourse: no pain  Menstrual History/LMP/Abnormal Bleeding: has IUD, cycles not as heavy, still cramping, but feeling better  Incontinence Symptoms: occasionally has stress incontinence  Advanced Care Planning: A voluntary discussion about advance care planning including the explanation and discussion of advance directives.  Discussed health care proxy and Living will, and the patient was able to identify a health care proxy as Park Meo - cousin.  Patient does not have a living will at present time.   Breast cancer:  HM Mammogram  Date Value Ref Range Status  06/14/2012 Category Zero-added views within Normal Limits  Final    BRCA gene screening: mother and maternal grandmother had breast cancer - discussed genetic testing but she is not interested  Cervical cancer screening: normal 2018  Lipids:   Lab Results  Component Value Date   CHOL 192 07/02/2017   CHOL 177 06/29/2016   CHOL 179 06/28/2015   Lab Results  Component Value Date   HDL 61 07/02/2017   HDL 63 06/29/2016   HDL 64 06/28/2015   Lab Results  Component Value Date   LDLCALC 112 (H) 07/02/2017   LDLCALC 91 06/29/2016   LDLCALC 75 06/28/2015   Lab Results  Component Value Date   TRIG 93 07/02/2017   TRIG 115 06/29/2016   TRIG 202 (H) 06/28/2015   Lab Results  Component Value Date   CHOLHDL 3.1 07/02/2017   CHOLHDL 2.8 06/29/2016   CHOLHDL 2.8 06/28/2015   No results found for: LDLDIRECT  Glucose:  Glucose, Bld  Date Value Ref Range Status  07/02/2017 94 65 - 99 mg/dL Final  06/29/2016 101 (H) 65 - 99 mg/dL Final    Skin cancer: discussed atypical lesions  Colorectal cancer: discussed colonoscopy and cologuard and she would like to try cologuard  RSW:NIOEVOJJK and she is not interested   Patient Active Problem List   Diagnosis Date Noted  . Menometrorrhagia 12/29/2017  . Allergic rhinitis 06/22/2015  . 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 Nonspecific reaction to tuberculin skin test 06/22/2015  . Vitamin D deficiency 06/22/2015    History reviewed. No pertinent surgical history.  Family History  Problem Relation Age of Onset  . Diabetes Mother   . Breast cancer Mother 54  .  Heart disease Father   . Heart attack Father 67  . Diabetes Sister        Middle Sister  . Hypertension Sister        Older Sister  . Thyroid disease Sister        Older Sister  . Breast cancer Maternal Grandmother 2    Social History   Socioeconomic History  . Marital status: Divorced    Spouse name: Not on file  . Number of children: 0  . Years of education: Not on file  . Highest education level: Not on file  Occupational History  . Occupation: business Theme park manager     Comment: degree in social work - works in [place]  . Financial resource strain: Not hard at all  . Food  insecurity:    Worry: Never true    Inability: Never true  . Transportation needs:    Medical: No    Non-medical: No  Tobacco Use  . Smoking status: Never Smoker  . Smokeless tobacco: Never Used  Substance and Sexual Activity  . Alcohol use: Yes    Alcohol/week: 0.0 standard drinks    Comment: socially  . Drug use: No  . Sexual activity: Not Currently  Lifestyle  . Physical activity:    Days per week: 2 days    Minutes per session: 60 min  . Stress: Not at all  Relationships  . Social connections:    Talks on phone: More than three times a week    Gets together: More than three times a week    Attends religious service: More than 4 times per year    Active member of club or organization: Yes    Attends meetings of clubs or organizations: More than 4 times per year    Relationship status: Divorced  . Intimate partner violence:    Fear of current or ex partner: No    Emotionally abused: No    Physically abused: No    Forced sexual activity: No  Other Topics Concern  . 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    She works at [employer] and works in [employer] and independent.      Current Outpatient Medications:  .  BIOTIN PO, Take 2 each by mouth daily., Disp: , Rfl:  .  Cholecalciferol (VITAMIN D3 GUMMIES ADULT PO), Take 2 each by mouth daily., Disp: , Rfl:  .  fexofenadine (ALLEGRA) 180 MG tablet, Take 1 tablet by mouth daily., Disp: , Rfl:  .  fluconazole (DIFLUCAN) 150 MG tablet, Take 1 tablet (150 mg total) by mouth every other day. For 3 days prn, Disp: 12 tablet, Rfl: 0 .  Multiple Vitamins-Minerals (MULTIVITAL) CHEW, Chew 1 tablet by mouth daily., Disp: , Rfl:  .  levonorgestrel (MIRENA) 20 MCG/24HR IUD, 1 Intra Uterine Device (1 each total) by Intrauterine route once for 1 dose., Disp: 1 each, Rfl: 0  Allergies  Allergen Reactions  . Latex    . Penicillin G Rash     ROS  Constitutional: Negative for fever or weight change.  Respiratory: Negative for cough and shortness of breath.   Cardiovascular: Negative for chest pain or palpitations.  Gastrointestinal: Negative for abdominal pain, no bowel changes.  Musculoskeletal: Negative for gait problem or joint swelling.  Skin: Negative for rash.  Neurological: Negative  for dizziness or headache.  No other specific complaints in a complete review of systems (except as listed in HPI above).  Objective  Vitals:   07/05/18 0830  BP: 110/66  Pulse: 75  Resp: 16  Temp: 98.4 F (36.9 C)  TempSrc: Oral  SpO2: 99%  Weight: 161 lb 8 oz (73.3 kg)  Height: _0  (1.626 m)    Body mass index is 27.72 kg/m.  Physical Exam  Constitutional: Patient appears well-developed and well-nourished. No distress.  HENT: Head: Normocephalic and atraumatic. Ears: B TMs ok, no erythema or effusion; Nose: Nose normal. Mouth/Throat: Oropharynx is clear and moist. No oropharyngeal exudate.  Eyes: Conjunctivae and EOM are normal. Pupils are equal, round, and reactive to light. No scleral icterus.  Neck: Normal range of motion. Neck supple. No JVD present. No thyromegaly present.  Cardiovascular: Normal rate, regular rhythm and normal heart sounds.  No murmur heard. No BLE edema. Pulmonary/Chest: Effort normal and breath sounds normal. No respiratory distress. Abdominal: Soft. Bowel sounds are normal, no distension. There is no tenderness. no masses Breast: no lumps or masses, no nipple discharge or rashes FEMALE GENITALIA:  External genitalia normal External urethra normal Vaginal vault normal without discharge or lesions Cervix normal without discharge or lesions Bimanual exam normal without masses RECTAL: not done  Musculoskeletal: Normal range of motion, no joint effusions. No gross deformities Neurological: he is alert and oriented to person, place, and time. No cranial nerve deficit.  Coordination, balance, strength, speech and gait are normal.  Skin: Skin is warm and dry. No rash noted. No erythema.  Psychiatric: Patient has a normal mood and affect. behavior is normal. Judgment and thought content normal.  PHQ2/9: Depression screen [facility] 2/9 07/05/2018 12/16/2017 10/15/2017 09/14/2017 07/02/2017  Decreased Interest 0 0 0 0 0  Down, Depressed, Hopeless 0 0 0 0 0  PHQ - 2 Score 0 0 0 0 0     Fall Risk: Fall Risk  07/05/2018 12/16/2017 10/15/2017 09/14/2017 07/02/2017  Falls in the past year? _1     Functional Status Survey: Is the patient deaf or have difficulty hearing?: No Does the patient have difficulty seeing, even when wearing glasses/contacts?: No Does the patient have difficulty concentrating, remembering, or making decisions?: No Does the patient have difficulty walking or climbing stairs?: No Does the patient have difficulty dressing or bathing?: No Does the patient have difficulty doing errands alone such as visiting a doctor's office or shopping?: No   Assessment & Plan  1. Well woman exam   - CBC with Differential/Platelet - Comprehensive metabolic panel - Lipid panel - Hemoglobin A1c - VITAMIN D 25 Hydroxy (Vit-D Deficiency, Fractures) - MM DIGITAL SCREENING BILATERAL; Future  2. Colon cancer screening  - Cologuard  3. Breast cancer screening  - MM DIGITAL SCREENING BILATERAL; Future  4. Vitamin D deficiency  - VITAMIN D 25 Hydroxy (Vit-D Deficiency, Fractures)  5. Cervical cancer screening  Up to date   6. Lipid screening  - Lipid panel  7. Screening for diabetes mellitus (DM)  - Hemoglobin A1c  8. Preclimacteric menorrhagia  Doing well on IUD   9. Routine screening for STI (sexually transmitted infection)  - Hepatitis, Acute - HIV antibody - RPR - GC/Chlamydia probe amp ([facility])not at [facility]   -USPSTF grade A and B recommendations reviewed with patient; age-appropriate recommendations, preventive care,  screening tests, etc discussed and encouraged; healthy living encouraged; see AVS for patient education given to patient -Discussed importance of  150 minutes of physical activity weekly, eat two servings of fish weekly, eat one serving of tree nuts ( cashews, pistachios, pecans, almonds.Marland Kitchen) every other day, eat 6 servings of fruit/vegetables daily and drink plenty of water and avoid sweet beverages.

## 2018-07-06 LAB — GC/CHLAMYDIA PROBE AMP (~~LOC~~) NOT AT ARMC
Chlamydia: NEGATIVE
Neisseria Gonorrhea: NEGATIVE

## 2018-07-06 LAB — CBC WITH DIFFERENTIAL/PLATELET
BASOS ABS: 28 {cells}/uL (ref 0–200)
Basophils Relative: 0.4 %
EOS ABS: 50 {cells}/uL (ref 15–500)
Eosinophils Relative: 0.7 %
HEMATOCRIT: 43.8 % (ref 35.0–45.0)
Hemoglobin: 14.3 g/dL (ref 11.7–15.5)
LYMPHS ABS: 2677 {cells}/uL (ref 850–3900)
MCH: 26.5 pg — AB (ref 27.0–33.0)
MCHC: 32.6 g/dL (ref 32.0–36.0)
MCV: 81.1 fL (ref 80.0–100.0)
MPV: 12 fL (ref 7.5–12.5)
Monocytes Relative: 7.7 %
NEUTROS ABS: 3799 {cells}/uL (ref 1500–7800)
NEUTROS PCT: 53.5 %
Platelets: 272 10*3/uL (ref 140–400)
RBC: 5.4 10*6/uL — ABNORMAL HIGH (ref 3.80–5.10)
RDW: 12.9 % (ref 11.0–15.0)
Total Lymphocyte: 37.7 %
WBC: 7.1 10*3/uL (ref 3.8–10.8)
WBCMIX: 547 {cells}/uL (ref 200–950)

## 2018-07-06 LAB — LIPID PANEL
CHOL/HDL RATIO: 3.2 (calc) (ref <5.0)
CHOLESTEROL: 170 mg/dL (ref <200)
HDL: 53 mg/dL (ref >50)
LDL Cholesterol (Calc): 98 mg/dL (calc)
Non-HDL Cholesterol (Calc): 117 mg/dL (calc) (ref <130)
Triglycerides: 91 mg/dL (ref <150)

## 2018-07-06 LAB — COMPLETE METABOLIC PANEL WITH GFR
AG RATIO: 1.5 (calc) (ref 1.0–2.5)
ALBUMIN MSPROF: 4.3 g/dL (ref 3.6–5.1)
ALT: 10 U/L (ref 6–29)
AST: 12 U/L (ref 10–35)
Alkaline phosphatase (APISO): 63 U/L (ref 33–130)
BUN: 12 mg/dL (ref 7–25)
CALCIUM: 9 mg/dL (ref 8.6–10.4)
CO2: 25 mmol/L (ref 20–32)
CREATININE: 0.88 mg/dL (ref 0.50–1.05)
Chloride: 100 mmol/L (ref 98–110)
GFR, EST AFRICAN AMERICAN: 89 mL/min/{1.73_m2} (ref >=60)
GFR, EST NON AFRICAN AMERICAN: 77 mL/min/{1.73_m2} (ref >=60)
GLOBULIN: 2.8 g/dL (ref 1.9–3.7)
Glucose, Bld: 97 mg/dL (ref 65–99)
POTASSIUM: 4.5 mmol/L (ref 3.5–5.3)
SODIUM: 135 mmol/L (ref 135–146)
Total Bilirubin: 1.8 mg/dL — ABNORMAL HIGH (ref 0.2–1.2)
Total Protein: 7.1 g/dL (ref 6.1–8.1)

## 2018-07-06 LAB — VITAMIN D 25 HYDROXY (VIT D DEFICIENCY, FRACTURES): VIT D 25 HYDROXY: 41 ng/mL (ref 30–100)

## 2018-07-06 LAB — HEPATITIS PANEL, ACUTE
HEP A IGM: NONREACTIVE
Hep B C IgM: NONREACTIVE
Hepatitis B Surface Ag: NONREACTIVE
Hepatitis C Ab: NONREACTIVE
SIGNAL TO CUT-OFF: 0.03 (ref <1.00)

## 2018-07-06 LAB — RPR: RPR: NONREACTIVE

## 2018-07-06 LAB — HEMOGLOBIN A1C W/OUT EAG: Hgb A1c MFr Bld: 5.7 % of total Hgb — ABNORMAL HIGH (ref <5.7)

## 2018-07-06 LAB — HIV ANTIBODY (ROUTINE TESTING W REFLEX): HIV 1&2 Ab, 4th Generation: NONREACTIVE

## 2018-08-12 LAB — COLOGUARD: COLOGUARD: NEGATIVE

## 2018-08-15 ENCOUNTER — Encounter: Payer: Self-pay | Admitting: Family Medicine

## 2018-08-24 ENCOUNTER — Encounter: Payer: Self-pay | Admitting: Family Medicine

## 2018-10-31 ENCOUNTER — Other Ambulatory Visit: Payer: Self-pay | Admitting: Family Medicine

## 2018-10-31 DIAGNOSIS — Z1231 Encounter for screening mammogram for malignant neoplasm of breast: Secondary | ICD-10-CM

## 2018-11-18 ENCOUNTER — Other Ambulatory Visit: Payer: Self-pay | Admitting: Family Medicine

## 2018-11-18 DIAGNOSIS — B3731 Acute candidiasis of vulva and vagina: Secondary | ICD-10-CM

## 2018-11-18 DIAGNOSIS — B373 Candidiasis of vulva and vagina: Secondary | ICD-10-CM

## 2018-11-18 NOTE — Telephone Encounter (Signed)
Refill request for general medication: Diflucan 150 mg  Last office visit: 07/05/2018  Last physical exam: 07/02/2017  Follow-ups on file. 07/07/2019

## 2018-11-20 MED ORDER — FLUCONAZOLE 150 MG PO TABS: 150.0000 mg | ORAL_TABLET | ORAL | 0 refills | Status: DC

## 2018-11-28 ENCOUNTER — Encounter (HOSPITAL_COMMUNITY): Payer: Self-pay

## 2018-11-28 ENCOUNTER — Ambulatory Visit
Admission: RE | Admit: 2018-11-28 | Discharge: 2018-11-28 | Disposition: A | Discharge status: Home / Self Care | Payer: BC Managed Care – PPO | Source: Ambulatory Visit | Attending: Family Medicine | Admitting: Family Medicine

## 2018-11-28 DIAGNOSIS — Z1231 Encounter for screening mammogram for malignant neoplasm of breast: Secondary | ICD-10-CM

## 2019-01-02 ENCOUNTER — Encounter: Payer: Self-pay | Admitting: Family Medicine

## 2019-05-01 ENCOUNTER — Other Ambulatory Visit: Payer: Self-pay | Admitting: Family Medicine

## 2019-05-01 DIAGNOSIS — B373 Candidiasis of vulva and vagina: Secondary | ICD-10-CM

## 2019-05-01 DIAGNOSIS — B3731 Acute candidiasis of vulva and vagina: Secondary | ICD-10-CM

## 2019-05-01 MED ORDER — FLUCONAZOLE 150 MG PO TABS: 150.0000 mg | ORAL_TABLET | ORAL | 0 refills | Status: DC

## 2019-07-07 ENCOUNTER — Other Ambulatory Visit (HOSPITAL_COMMUNITY)
Admission: RE | Admit: 2019-07-07 | Discharge: 2019-07-07 | Disposition: A | Discharge status: Home / Self Care | Payer: BC Managed Care – PPO | Source: Ambulatory Visit | Attending: Family Medicine | Admitting: Family Medicine

## 2019-07-07 ENCOUNTER — Ambulatory Visit (INDEPENDENT_AMBULATORY_CARE_PROVIDER_SITE_OTHER): Payer: BC Managed Care – PPO | Admitting: Family Medicine

## 2019-07-07 ENCOUNTER — Other Ambulatory Visit: Payer: Self-pay

## 2019-07-07 ENCOUNTER — Encounter: Payer: Self-pay | Admitting: Family Medicine

## 2019-07-07 VITALS — BP 110/70 | HR 75 | Temp 96.9°F | Resp 16 | Ht 64.0 in | Wt 150.5 lb

## 2019-07-07 DIAGNOSIS — Z01419 Encounter for gynecological examination (general) (routine) without abnormal findings: Secondary | ICD-10-CM | POA: Diagnosis not present

## 2019-07-07 DIAGNOSIS — R300 Dysuria: Secondary | ICD-10-CM

## 2019-07-07 DIAGNOSIS — R351 Nocturia: Secondary | ICD-10-CM

## 2019-07-07 DIAGNOSIS — Z113 Encounter for screening for infections with a predominantly sexual mode of transmission: Secondary | ICD-10-CM

## 2019-07-07 DIAGNOSIS — Z1239 Encounter for other screening for malignant neoplasm of breast: Secondary | ICD-10-CM

## 2019-07-07 DIAGNOSIS — Z131 Encounter for screening for diabetes mellitus: Secondary | ICD-10-CM | POA: Diagnosis not present

## 2019-07-07 DIAGNOSIS — Z789 Other specified health status: Secondary | ICD-10-CM

## 2019-07-07 DIAGNOSIS — Z87440 Personal history of urinary (tract) infections: Secondary | ICD-10-CM

## 2019-07-07 DIAGNOSIS — R634 Abnormal weight loss: Secondary | ICD-10-CM

## 2019-07-07 DIAGNOSIS — Z13220 Encounter for screening for lipoid disorders: Secondary | ICD-10-CM | POA: Diagnosis not present

## 2019-07-07 MED ORDER — PHENAZOPYRIDINE HCL 100 MG PO TABS: 100.0000 mg | ORAL_TABLET | Freq: Three times a day (TID) | ORAL | 0 refills | Status: DC | PRN

## 2019-07-07 NOTE — Patient Instructions (Signed)
Preventive Care 40-51 Years Old, Female Preventive care refers to visits with your health care provider and lifestyle choices that can promote health and wellness. This includes:  A yearly physical exam. This may also be called an annual well check.  Regular dental visits and eye exams.  Immunizations.  Screening for certain conditions.  Healthy lifestyle choices, such as eating a healthy diet, getting regular exercise, not using drugs or products that contain nicotine and tobacco, and limiting alcohol use. What can I expect for my preventive care visit? Physical exam Your health care provider will check your:  Height and weight. This may be used to calculate body mass index (BMI), which tells if you are at a healthy weight.  Heart rate and blood pressure.  Skin for abnormal spots. Counseling Your health care provider may ask you questions about your:  Alcohol, tobacco, and drug use.  Emotional well-being.  Home and relationship well-being.  Sexual activity.  Eating habits.  Work and work environment.  Method of birth control.  Menstrual cycle.  Pregnancy history. What immunizations do I need?  Influenza (flu) vaccine  This is recommended every year. Tetanus, diphtheria, and pertussis (Tdap) vaccine  You may need a Td booster every 10 years. Varicella (chickenpox) vaccine  You may need this if you have not been vaccinated. Zoster (shingles) vaccine  You may need this after age 51. Measles, mumps, and rubella (MMR) vaccine  You may need at least one dose of MMR if you were born in 1957 or later. You may also need a second dose. Pneumococcal conjugate (PCV13) vaccine  You may need this if you have certain conditions and were not previously vaccinated. Pneumococcal polysaccharide (PPSV23) vaccine  You may need one or two doses if you smoke cigarettes or if you have certain conditions. Meningococcal conjugate (MenACWY) vaccine  You may need this if you  have certain conditions. Hepatitis A vaccine  You may need this if you have certain conditions or if you travel or work in places where you may be exposed to hepatitis A. Hepatitis B vaccine  You may need this if you have certain conditions or if you travel or work in places where you may be exposed to hepatitis B. Haemophilus influenzae type b (Hib) vaccine  You may need this if you have certain conditions. Human papillomavirus (HPV) vaccine  If recommended by your health care provider, you may need three doses over 6 months. You may receive vaccines as individual doses or as more than one vaccine together in one shot (combination vaccines). Talk with your health care provider about the risks and benefits of combination vaccines. What tests do I need? Blood tests  Lipid and cholesterol levels. These may be checked every 5 years, or more frequently if you are over 51 years old.  Hepatitis C test.  Hepatitis B test. Screening  Lung cancer screening. You may have this screening every year starting at age 51 if you have a 30-pack-year history of smoking and currently smoke or have quit within the past 15 years.  Colorectal cancer screening. All adults should have this screening starting at age 51 and continuing until age 51. Your health care provider may recommend screening at age 45 if you are at increased risk. You will have tests every 1-10 years, depending on your results and the type of screening test.  Diabetes screening. This is done by checking your blood sugar (glucose) after you have not eaten for a while (fasting). You may have this   done every 1-3 years.  Mammogram. This may be done every 1-2 years. Talk with your health care provider about when you should start having regular mammograms. This may depend on whether you have a family history of breast cancer.  BRCA-related cancer screening. This may be done if you have a family history of breast, ovarian, tubal, or peritoneal  cancers.  Pelvic exam and Pap test. This may be done every 3 years starting at age 51. Starting at age 51, this may be done every 5 years if you have a Pap test in combination with an HPV test. Other tests  Sexually transmitted disease (STD) testing.  Bone density scan. This is done to screen for osteoporosis. You may have this scan if you are at high risk for osteoporosis. Follow these instructions at home: Eating and drinking  Eat a diet that includes fresh fruits and vegetables, whole grains, lean protein, and low-fat dairy.  Take vitamin and mineral supplements as recommended by your health care provider.  Do not drink alcohol if: ? Your health care provider tells you not to drink. ? You are pregnant, may be pregnant, or are planning to become pregnant.  If you drink alcohol: ? Limit how much you have to 0-1 drink a day. ? Be aware of how much alcohol is in your drink. In the [place] one drink equals one 12 oz bottle of beer (355 mL), one 5 oz glass of wine (148 mL), or one 1 oz glass of hard liquor (44 mL). Lifestyle  Take daily care of your teeth and gums.  Stay active. Exercise for at least 30 minutes on 5 or more days each week.  Do not use any products that contain nicotine or tobacco, such as cigarettes, e-cigarettes, and chewing tobacco. If you need help quitting, ask your health care provider.  If you are sexually active, practice safe sex. Use a condom or other form of birth control (contraception) in order to prevent pregnancy and STIs (sexually transmitted infections).  If told by your health care provider, take low-dose aspirin daily starting at age 51. What's next?  Visit your health care provider once a year for a well check visit.  Ask your health care provider how often you should have your eyes and teeth checked.  Stay up to date on all vaccines. This information is not intended to replace advice given to you by your health care provider. Make sure you  discuss any questions you have with your health care provider. Document Released: 11/29/2015 Document Revised: 07/14/2018 Document Reviewed: 07/14/2018 Elsevier Patient Education  2020 [employer]  

## 2019-07-07 NOTE — Progress Notes (Signed)
Name: Amber Hardin   MRN: [id number]    DOB: September 18, 1966   Date:07/07/2019       Progress Note  Subjective  Chief Complaint  Chief Complaint  Patient presents with  . Annual Exam    HPI   Patient presents for annual CPE and follow up  Urinary frequency and recent UTI, she wast treated in July at [facility] and was given antibiotics, second culture was negative, but she continues to have urgency and frequency.She has weight loss, but states very physically active and trying to lose weight. She has been a little stressed with all the changes, mother is moving to [place], coronavirus, BL[employer] movement.   Diet: well balanced diet  Exercise: very active  USPSTF grade A and B recommendations    Office Visit from 07/07/2019 in [facility]  AUDIT-C Score  0     Depression: Phq 9 is  negative Depression screen [facility] 2/9 07/07/2019 07/05/2018 12/16/2017 10/15/2017 09/14/2017  Decreased Interest 0 0 0 0 0  Down, Depressed, Hopeless 0 0 0 0 0  PHQ - 2 Score 0 0 0 0 0  Altered sleeping 0 - - - -  Tired, decreased energy 0 - - - -  Change in appetite 0 - - - -  Feeling bad or failure about yourself  0 - - - -  Trouble concentrating 0 - - - -  Moving slowly or fidgety/restless 0 - - - -  Suicidal thoughts 0 - - - -  PHQ-9 Score 0 - - - -   Hypertension: BP Readings from Last 3 Encounters:  07/07/19 110/70  07/05/18 110/66  01/27/18 112/72   Obesity: Wt Readings from Last 3 Encounters:  07/07/19 150 lb 8 oz (68.3 kg)  07/05/18 161 lb 8 oz (73.3 kg)  01/27/18 163 lb (73.9 kg)   BMI Readings from Last 3 Encounters:  07/07/19 25.83 kg/m  07/05/18 27.72 kg/m  01/27/18 27.98 kg/m    Hep C Screening: up to date STD testing and prevention (HIV/chl/gon/syphilis): she wants it today  Intimate partner violence:negative screen Sexual History/Pain during Intercourse: no pain during intercourse Menstrual History/LMP/Abnormal Bleeding: she has IUD, skipping cycles  some cramping intermittently Incontinence Symptoms: nocturia since recent bladder infection   Advanced Care Planning: A voluntary discussion about advance care planning including the explanation and discussion of advance directives.  Discussed health care proxy and Living will, and the patient was able to identify a health care proxy as [place]   Patient does not have a living will at present time..  Breast cancer: yearly, since family history of breast cancer - maternal grandmother and mother BRCA gene screening: she does not want to be tested  Cervical cancer screening: up to date   Osteoporosis Screening: discussed high calcium and vitamin D diet   Lipids:  Lab Results  Component Value Date   CHOL 170 07/05/2018   CHOL 192 07/02/2017   CHOL 177 06/29/2016   Lab Results  Component Value Date   HDL 53 07/05/2018   HDL 61 07/02/2017   HDL 63 06/29/2016   Lab Results  Component Value Date   LDLCALC 98 07/05/2018   LDLCALC 112 (H) 07/02/2017   LDLCALC 91 06/29/2016   Lab Results  Component Value Date   TRIG 91 07/05/2018   TRIG 93 07/02/2017   TRIG 115 06/29/2016   Lab Results  Component Value Date   CHOLHDL 3.2 07/05/2018   CHOLHDL 3.1 07/02/2017   CHOLHDL  2.8 06/29/2016   No results found for: LDLDIRECT  Glucose:  Glucose, Bld  Date Value Ref Range Status  07/05/2018 97 65 - 99 mg/dL Final    Comment:    .            Fasting reference interval .   07/02/2017 94 65 - 99 mg/dL Final  06/29/2016 101 (H) 65 - 99 mg/dL Final    Skin cancer: discussed atypical lesions  Colorectal cancer: cologuard negative 2019 Lung cancer:   Low Dose CT Chest recommended if Age 47-80 years, 30 pack-year currently smoking OR have quit w/in 15years. Patient does not qualify.    Patient Active Problem List   Diagnosis Date Noted  . Menometrorrhagia 12/29/2017  . Allergic rhinitis 06/22/2015  . 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 Nonspecific reaction to tuberculin  skin test 06/22/2015  . Vitamin D deficiency 06/22/2015    History reviewed. No pertinent surgical history.  Family History  Problem Relation Age of Onset  . Diabetes Mother   . Breast cancer Mother 53  . Cancer Mother   . Heart disease Father   . Heart attack Father 62  . COPD Father   . Diabetes Father   . Diabetes Sister        Middle Sister  . Hypertension Sister        Older Sister  . Thyroid disease Sister        Older Sister  . Breast cancer Maternal Grandmother 66  . Cancer Maternal Grandfather     Social History   Socioeconomic History  . Marital status: Divorced    Spouse name: Not on file  . Number of children: 0  . Years of education: Not on file  . Highest education level: Not on file  Occupational History  . Occupation: business Theme park manager     Comment: degree in social work - works in [place]  . Financial resource strain: Not hard at all  . Food insecurity    Worry: Never true    Inability: Never true  . Transportation needs    Medical: No    Non-medical: No  Tobacco Use  . Smoking status: Never Smoker  . Smokeless tobacco: Never Used  Substance and Sexual Activity  . Alcohol use: Not Currently    Alcohol/week: 0.0 standard drinks    Comment: socially  . Drug use: No  . Sexual activity: Yes    Birth control/protection: Condom, I.U.D.  Lifestyle  . Physical activity    Days per week: 3 days    Minutes per session: 60 min  . Stress: Not at all  Relationships  . Social connections    Talks on phone: More than three times a week    Gets together: More than three times a week    Attends religious service: More than 4 times per year    Active member of club or organization: Yes    Attends meetings of clubs or organizations: More than 4 times per year    Relationship status: Divorced  . Intimate partner violence    Fear of current or ex partner: No    Emotionally abused: No    Physically abused: No    Forced sexual  activity: No  Other Topics Concern  . 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 and works in [employer] and independent. Currently dating      Current Outpatient Medications:  .  BIOTIN PO, Take 2 each by mouth daily., Disp: , Rfl:  .  Cholecalciferol (VITAMIN D3 GUMMIES ADULT PO), Take 2 each by mouth daily., Disp: , Rfl:  .  fexofenadine (ALLEGRA) 180 MG tablet, Take 1 tablet by mouth daily., Disp: , Rfl:  .  fluconazole (DIFLUCAN) 150 MG tablet, Take 1 tablet (150 mg total) by mouth every other day. For 3 days prn, Disp: 12 tablet, Rfl: 0 .  Multiple Vitamins-Minerals (MULTIVITAL) CHEW, Chew 1 tablet by mouth daily., Disp: , Rfl:  .  levonorgestrel (MIRENA) 20 MCG/24HR IUD, 1 Intra Uterine Device (1 each total) by Intrauterine route once for 1 dose., Disp: 1 each, Rfl: 0  Allergies  Allergen Reactions  . Latex   . Penicillin G Rash     ROS  Constitutional: Negative for fever , positive for weight change.  Respiratory: Negative for cough and shortness of breath.   Cardiovascular: Negative for chest pain or palpitations.  Gastrointestinal: Negative for abdominal pain, no bowel changes.  Musculoskeletal: Negative for gait problem or joint swelling.  Skin: Negative for rash.  Neurological: Negative for dizziness or headache.  No other specific complaints in a complete review of systems (except as listed in HPI above).  Objective  Vitals:   07/07/19 0833  BP: 110/70  Pulse: 75  Resp: 16  Temp: (!) 96.9 F (36.1 C)  TempSrc: Temporal  SpO2: 99%  Weight: 150 lb 8 oz (68.3 kg)  Height: '5\' 4"'  (1.626 m)    Body mass index is 25.83 kg/m.  Physical Exam  Constitutional: Patient appears well-developed and well-nourished. No distress.  HENT: Head: Normocephalic and atraumatic. Ears: B TMs ok, no erythema or  effusion; Nose: Nose normal. Mouth/Throat: Oropharynx is clear and moist. No oropharyngeal exudate.  Eyes: Conjunctivae and EOM are normal. Pupils are equal, round, and reactive to light. No scleral icterus.  Neck: Normal range of motion. Neck supple. No JVD present. No thyromegaly present.  Cardiovascular: Normal rate, regular rhythm and normal heart sounds.  No murmur heard. No BLE edema. Pulmonary/Chest: Effort normal and breath sounds normal. No respiratory distress. Abdominal: Soft. Bowel sounds are normal, no distension. There is no tenderness. no masses Breast: no lumps or masses, no nipple discharge or rashes FEMALE GENITALIA:  Not done  RECTAL: not done Musculoskeletal: Normal range of motion, no joint effusions. No gross deformities Neurological: he is alert and oriented to person, place, and time. No cranial nerve deficit. Coordination, balance, strength, speech and gait are normal.  Skin: Skin is warm and dry. No rash noted. No erythema.  Psychiatric: Patient has a normal mood and affect. behavior is normal. Judgment and thought content normal.   PHQ2/9: Depression screen [facility] 2/9 07/07/2019 07/05/2018 12/16/2017 10/15/2017 09/14/2017  Decreased Interest 0 0 0 0 0  Down, Depressed, Hopeless 0 0 0 0 0  PHQ - 2 Score 0 0 0 0 0  Altered sleeping 0 - - - -  Tired, decreased energy 0 - - - -  Change in appetite 0 - - - -  Feeling bad or failure about yourself  0 - - - -  Trouble concentrating 0 - - - -  Moving slowly or fidgety/restless 0 - - - -  Suicidal thoughts 0 - - - -  PHQ-9 Score 0 - - - -     Fall  Risk: Fall Risk  07/05/2018 12/16/2017 10/15/2017 09/14/2017 07/02/2017  Falls in the past year? No No No No No     Functional Status Survey: Is the patient deaf or have difficulty hearing?: No Does the patient have difficulty seeing, even when wearing glasses/contacts?: No Does the patient have difficulty concentrating, remembering, or making decisions?: No Does the  patient have difficulty walking or climbing stairs?: No Does the patient have difficulty dressing or bathing?: No Does the patient have difficulty doing errands alone such as visiting a doctor's office or shopping?: No   Assessment & Plan  1. Well woman exam  - Measles/Mumps/Rubella Immunity - COMPLETE METABOLIC PANEL WITH GFR - CBC with Differential/Platelet  2. Breast cancer screening   3. Lipid screening  - Lipid panel  4. Screening for diabetes mellitus (DM)  - Hemoglobin A1c  5. Weight loss  - CBC with Differential/Platelet - TSH  6. Routine screening for STI (sexually transmitted infection)  - RPR - HIV Antibody (routine testing w rflx) - Cervicovaginal ancillary only  7. Nocturia  - CULTURE, URINE COMPREHENSIVE - phenazopyridine (PYRIDIUM) 100 MG tablet; Take 1 tablet (100 mg total) by mouth 3 (three) times daily as needed for pain.  Dispense: 12 tablet; Refill: 0  -USPSTF grade A and B recommendations reviewed with patient; age-appropriate recommendations, preventive care, screening tests, etc discussed and encouraged; healthy living encouraged; see AVS for patient education given to patient -Discussed importance of 150 minutes of physical activity weekly, eat two servings of fish weekly, eat one serving of tree nuts ( cashews, pistachios, pecans, almonds.Marland Kitchen) every other day, eat 6 servings of fruit/vegetables daily and drink plenty of water and avoid sweet beverages.

## 2019-07-10 LAB — COMPLETE METABOLIC PANEL WITH GFR
AG Ratio: 1.6 (calc) (ref 1.0–2.5)
ALT: 9 U/L (ref 6–29)
AST: 10 U/L (ref 10–35)
Albumin: 4.4 g/dL (ref 3.6–5.1)
Alkaline phosphatase (APISO): 65 U/L (ref 37–153)
BUN: 9 mg/dL (ref 7–25)
CO2: 27 mmol/L (ref 20–32)
Calcium: 9.6 mg/dL (ref 8.6–10.4)
Chloride: 102 mmol/L (ref 98–110)
Creat: 0.84 mg/dL (ref 0.50–1.05)
GFR, Est African American: 93 mL/min/{1.73_m2} (ref >=60)
GFR, Est Non African American: 80 mL/min/{1.73_m2} (ref >=60)
Globulin: 2.7 g/dL (calc) (ref 1.9–3.7)
Glucose, Bld: 99 mg/dL (ref 65–99)
Potassium: 4.1 mmol/L (ref 3.5–5.3)
Sodium: 139 mmol/L (ref 135–146)
Total Bilirubin: 1.3 mg/dL — ABNORMAL HIGH (ref 0.2–1.2)
Total Protein: 7.1 g/dL (ref 6.1–8.1)

## 2019-07-10 LAB — MEASLES/MUMPS/RUBELLA IMMUNITY
Mumps IgG: 278 AU/mL
Rubella: 14.3 index
Rubeola IgG: 15.8 AU/mL — ABNORMAL LOW

## 2019-07-10 LAB — CBC WITH DIFFERENTIAL/PLATELET
Absolute Monocytes: 462 cells/uL (ref 200–950)
Basophils Absolute: 39 cells/uL (ref 0–200)
Basophils Relative: 0.5 %
Eosinophils Absolute: 31 cells/uL (ref 15–500)
Eosinophils Relative: 0.4 %
HCT: 44.8 % (ref 35.0–45.0)
Hemoglobin: 14.4 g/dL (ref 11.7–15.5)
Lymphs Abs: 2318 cells/uL (ref 850–3900)
MCH: 27.2 pg (ref 27.0–33.0)
MCHC: 32.1 g/dL (ref 32.0–36.0)
MCV: 84.7 fL (ref 80.0–100.0)
MPV: 11.8 fL (ref 7.5–12.5)
Monocytes Relative: 6 %
Neutro Abs: 4851 cells/uL (ref 1500–7800)
Neutrophils Relative %: 63 %
Platelets: 299 10*3/uL (ref 140–400)
RBC: 5.29 10*6/uL — ABNORMAL HIGH (ref 3.80–5.10)
RDW: 12.9 % (ref 11.0–15.0)
Total Lymphocyte: 30.1 %
WBC: 7.7 10*3/uL (ref 3.8–10.8)

## 2019-07-10 LAB — HIV ANTIBODY (ROUTINE TESTING W REFLEX): HIV 1&2 Ab, 4th Generation: NONREACTIVE

## 2019-07-10 LAB — CULTURE, URINE COMPREHENSIVE
MICRO NUMBER:: 799564
RESULT:: NO GROWTH
SPECIMEN QUALITY:: ADEQUATE

## 2019-07-10 LAB — LIPID PANEL
Cholesterol: 176 mg/dL (ref <200)
HDL: 57 mg/dL (ref >=50)
LDL Cholesterol (Calc): 101 mg/dL (calc) — ABNORMAL HIGH
Non-HDL Cholesterol (Calc): 119 mg/dL (calc) (ref <130)
Total CHOL/HDL Ratio: 3.1 (calc) (ref <5.0)
Triglycerides: 88 mg/dL (ref <150)

## 2019-07-10 LAB — HEMOGLOBIN A1C
Hgb A1c MFr Bld: 5.6 % of total Hgb (ref <5.7)
Mean Plasma Glucose: 114 (calc)
eAG (mmol/L): 6.3 (calc)

## 2019-07-10 LAB — TSH: TSH: 0.87 mIU/L

## 2019-07-10 LAB — RPR: RPR Ser Ql: NONREACTIVE

## 2019-07-11 LAB — CERVICOVAGINAL ANCILLARY ONLY
Bacterial vaginitis: NEGATIVE
Candida vaginitis: NEGATIVE
Chlamydia: NEGATIVE
Neisseria Gonorrhea: NEGATIVE
Trichomonas: NEGATIVE

## 2019-07-14 ENCOUNTER — Other Ambulatory Visit: Payer: Self-pay

## 2019-07-18 ENCOUNTER — Encounter: Payer: Self-pay | Admitting: Family Medicine

## 2019-08-07 ENCOUNTER — Encounter: Payer: Self-pay | Admitting: Family Medicine

## 2019-08-07 DIAGNOSIS — R7309 Other abnormal glucose: Secondary | ICD-10-CM | POA: Insufficient documentation

## 2019-08-29 ENCOUNTER — Encounter: Payer: Self-pay | Admitting: Family Medicine

## 2019-08-29 ENCOUNTER — Other Ambulatory Visit: Payer: Self-pay | Admitting: Family Medicine

## 2019-08-29 DIAGNOSIS — B3731 Acute candidiasis of vulva and vagina: Secondary | ICD-10-CM

## 2019-08-29 DIAGNOSIS — B373 Candidiasis of vulva and vagina: Secondary | ICD-10-CM

## 2019-08-30 ENCOUNTER — Other Ambulatory Visit: Payer: Self-pay | Admitting: Family Medicine

## 2019-08-30 DIAGNOSIS — B3731 Acute candidiasis of vulva and vagina: Secondary | ICD-10-CM

## 2019-08-30 DIAGNOSIS — B373 Candidiasis of vulva and vagina: Secondary | ICD-10-CM

## 2019-08-30 MED ORDER — FLUCONAZOLE 150 MG PO TABS: 150.0000 mg | ORAL_TABLET | ORAL | 0 refills | Status: DC

## 2019-08-31 ENCOUNTER — Other Ambulatory Visit: Payer: Self-pay | Admitting: Family Medicine

## 2019-08-31 DIAGNOSIS — Z01419 Encounter for gynecological examination (general) (routine) without abnormal findings: Secondary | ICD-10-CM

## 2019-08-31 DIAGNOSIS — Z1239 Encounter for other screening for malignant neoplasm of breast: Secondary | ICD-10-CM

## 2019-10-26 ENCOUNTER — Other Ambulatory Visit: Payer: Self-pay | Admitting: Family Medicine

## 2019-10-26 DIAGNOSIS — Z1231 Encounter for screening mammogram for malignant neoplasm of breast: Secondary | ICD-10-CM

## 2019-11-04 IMAGING — MG MM DIGITAL DIAGNOSTIC UNILAT*R* W/ TOMO W/ CAD
8 of 12 series · 8 of 24 positions shown · non-contrast
Comparison: Previous exam(s).

CLINICAL DATA: Screening recall for a right breast asymmetry and
right breast calcifications.

EXAM:
2D DIGITAL DIAGNOSTIC UNILATERAL RIGHT MAMMOGRAM WITH CAD AND
ADJUNCT TOMO
RIGHT BREAST ULTRASOUND

[R ML (1 of 3)]
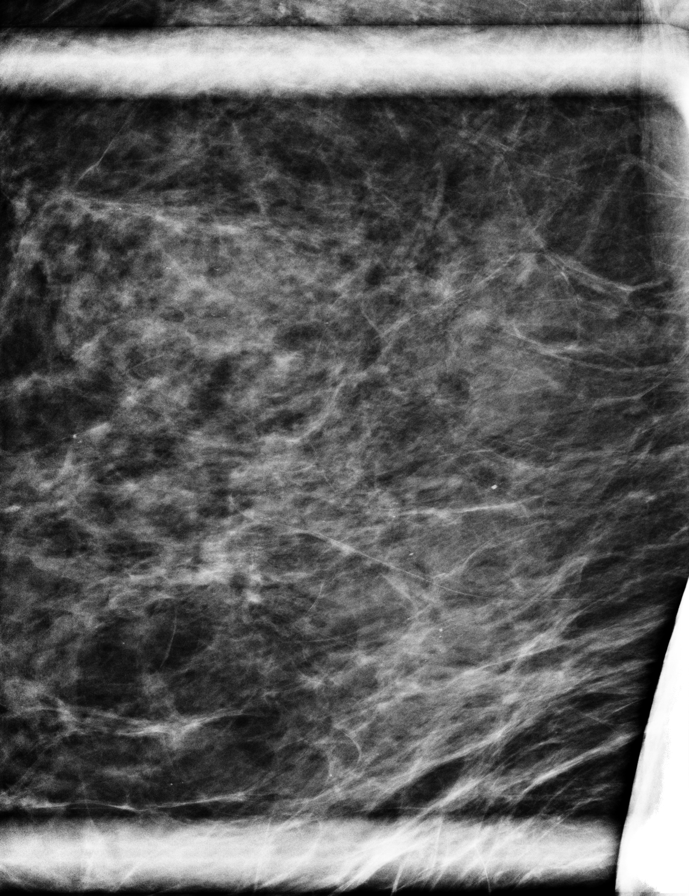

[R ML (2 of 3)]
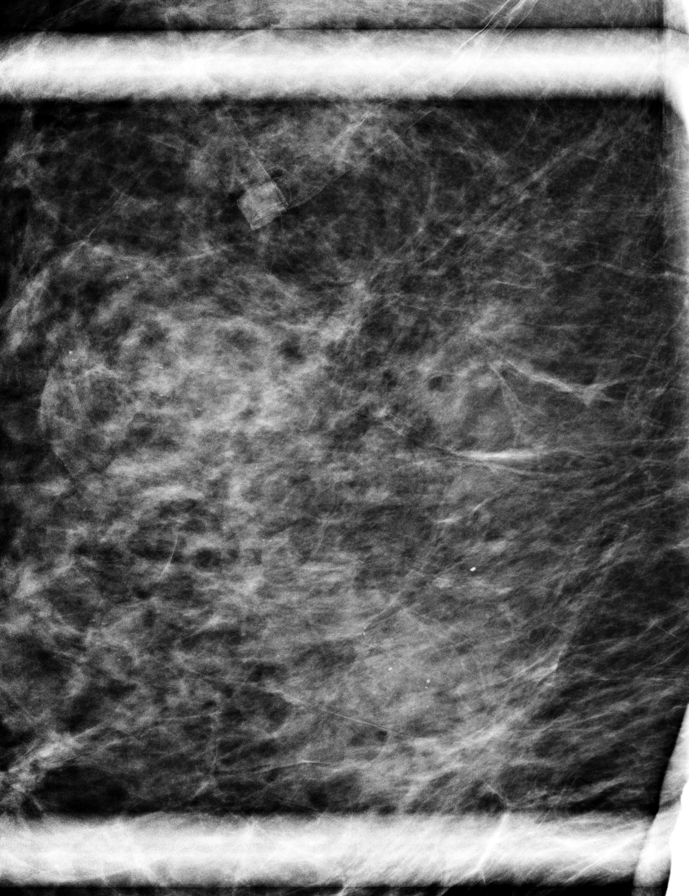

[R CC (1 of 2)]
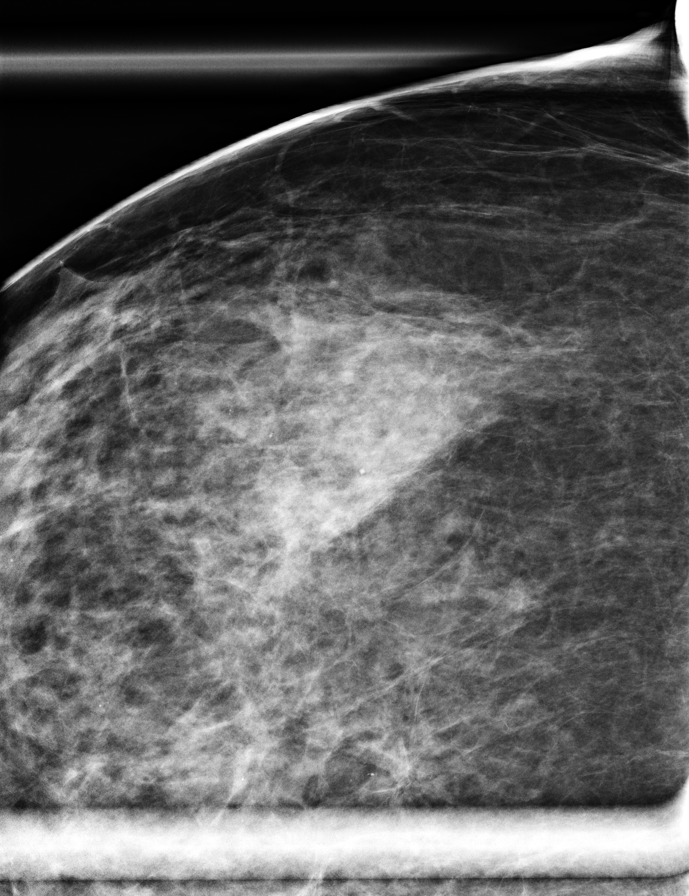

[R ML (3 of 3)]
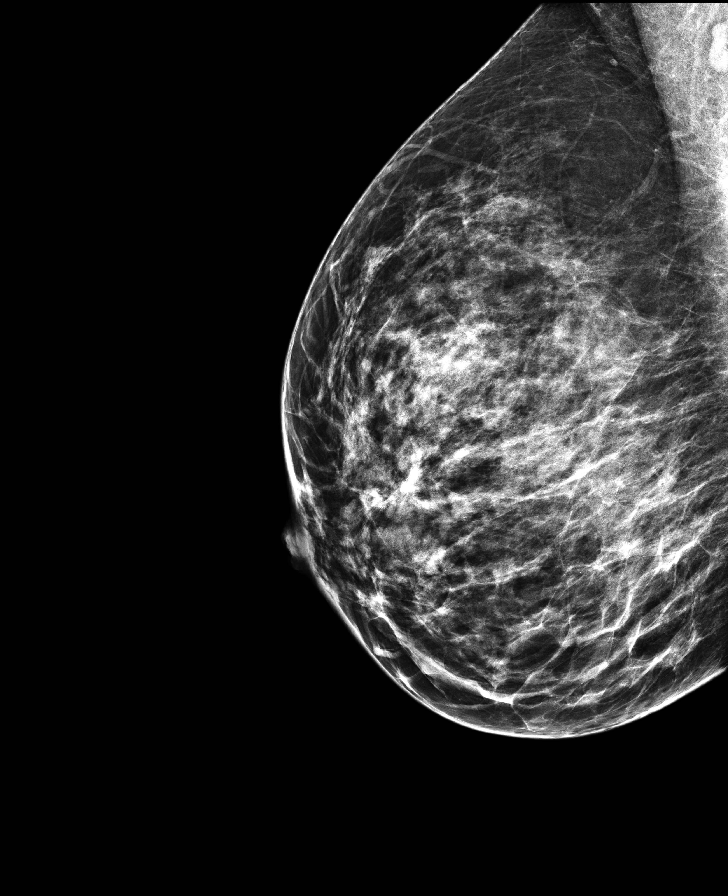

[R MLO]
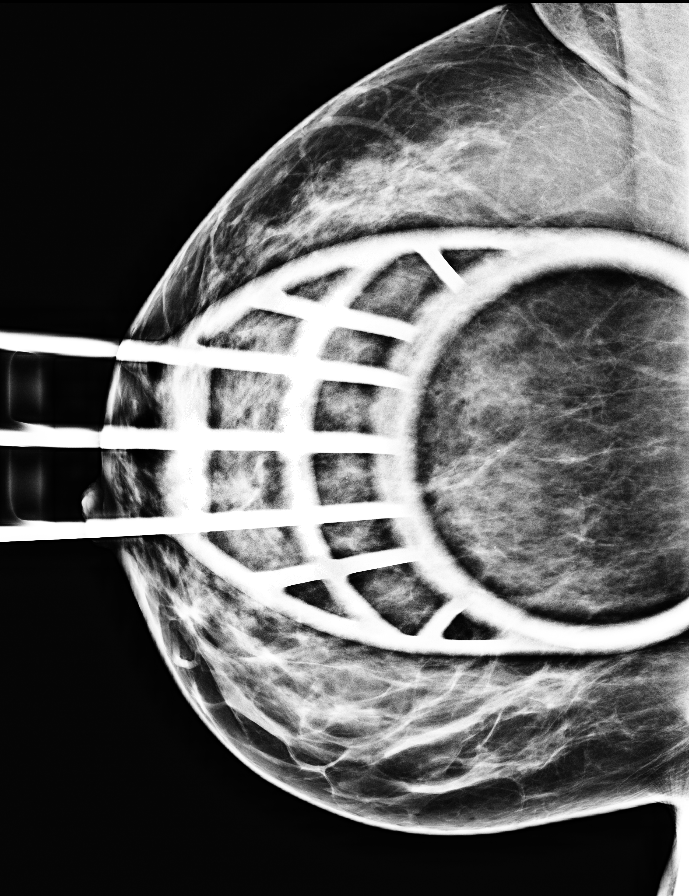

[R MLO synth-2D]
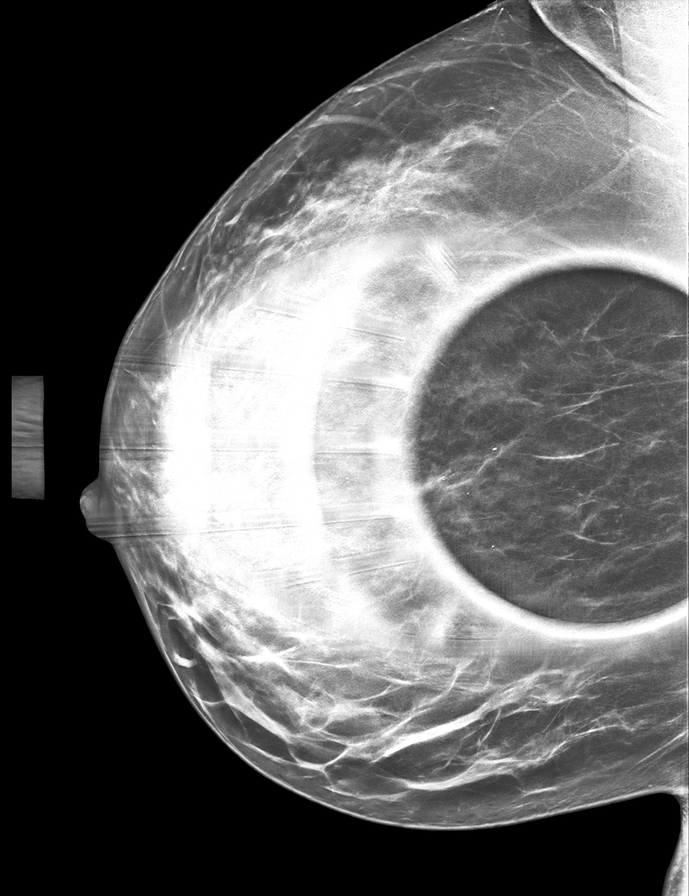

[R CC (2 of 2)]
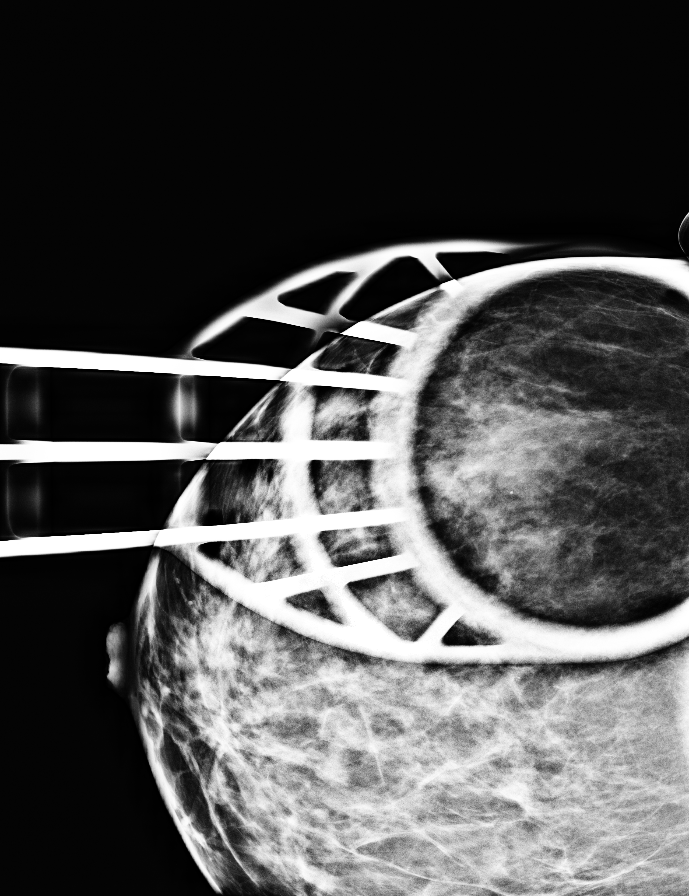

[R CC synth-2D]
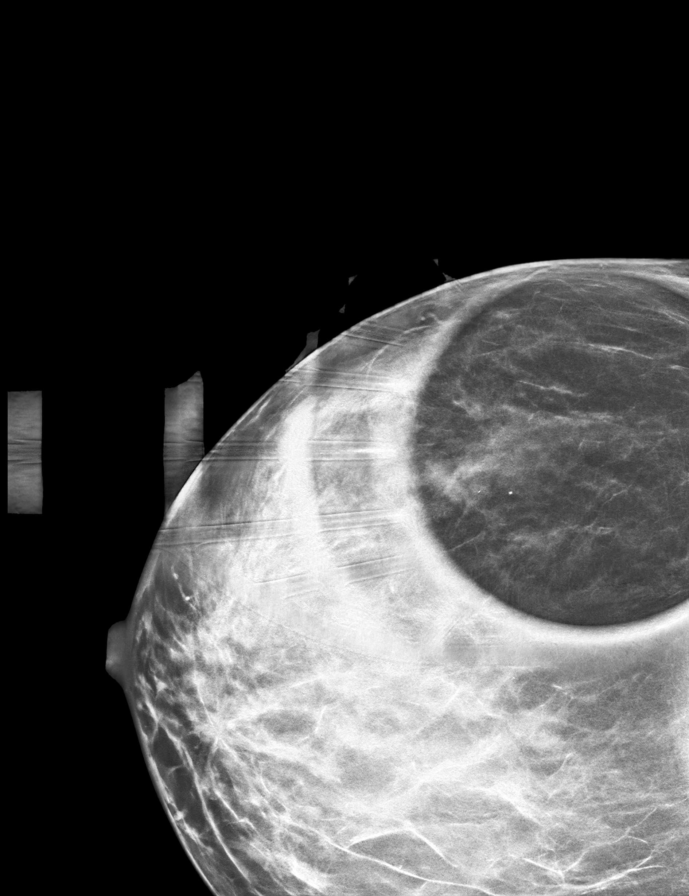

[8 of 24 positions shown; findings below may reference images not displayed]

ACR Breast Density Category c: The breast tissue is heterogeneously
dense, which may obscure small masses.
FINDINGS: The calcifications identified in the lateral aspect of the right
breast a layer on the true lateral view consistent with benign milk
of calcium. The asymmetry identified in the lateral aspect of the
right breast appears less prominent on the spot compression
tomosynthesis imaging, however, due to the density of the
surrounding breast tissue, ultrasound will be performed for further
evaluation.

Mammographic images were processed with CAD.

Ultrasound targeted to the lateral aspect of the right breast
demonstrates several anechoic oval circumscribed masses consistent
with benign cysts. Representative images were acquired at the 8
o'clock position.
IMPRESSION: The asymmetry with calcifications in the lateral aspect of the right
breast corresponds with benign milk of calcium and benign cysts.

RECOMMENDATION:
Screening mammogram in one year.(Code:8O-V-A13)

I have discussed the findings and recommendations with the patient.
Results were also provided in writing at the conclusion of the
visit. If applicable, a reminder letter will be sent to the patient
regarding the next appointment.

BI-RADS CATEGORY  2: Benign.

## 2019-11-19 ENCOUNTER — Encounter: Payer: Self-pay | Admitting: Family Medicine

## 2019-12-04 ENCOUNTER — Ambulatory Visit
Admission: RE | Admit: 2019-12-04 | Discharge: 2019-12-04 | Disposition: A | Discharge status: Home / Self Care | Payer: BC Managed Care – PPO | Source: Ambulatory Visit | Attending: Family Medicine | Admitting: Family Medicine

## 2019-12-04 DIAGNOSIS — Z1231 Encounter for screening mammogram for malignant neoplasm of breast: Secondary | ICD-10-CM | POA: Insufficient documentation

## 2019-12-14 ENCOUNTER — Telehealth: Payer: Self-pay | Admitting: Family Medicine

## 2019-12-14 ENCOUNTER — Other Ambulatory Visit: Payer: Self-pay | Admitting: Family Medicine

## 2019-12-14 DIAGNOSIS — B3731 Acute candidiasis of vulva and vagina: Secondary | ICD-10-CM

## 2019-12-14 DIAGNOSIS — B373 Candidiasis of vulva and vagina: Secondary | ICD-10-CM

## 2019-12-14 NOTE — Telephone Encounter (Signed)
rx refillfluconazole (DIFLUCAN) 150 MG tablet PHARMACY [facility] [address] [place]), [place] - [id number] Phone:  [phone]  Fax:  [phone]     Patient call back [phone]

## 2019-12-16 ENCOUNTER — Other Ambulatory Visit: Payer: Self-pay | Admitting: Family Medicine

## 2019-12-16 DIAGNOSIS — B373 Candidiasis of vulva and vagina: Secondary | ICD-10-CM

## 2019-12-16 DIAGNOSIS — B3731 Acute candidiasis of vulva and vagina: Secondary | ICD-10-CM

## 2019-12-16 MED ORDER — FLUCONAZOLE 150 MG PO TABS: 150.0000 mg | ORAL_TABLET | ORAL | 0 refills | Status: DC

## 2019-12-18 ENCOUNTER — Encounter: Payer: Self-pay | Admitting: Family Medicine

## 2019-12-18 ENCOUNTER — Other Ambulatory Visit: Payer: Self-pay

## 2019-12-18 ENCOUNTER — Other Ambulatory Visit (HOSPITAL_COMMUNITY)
Admission: RE | Admit: 2019-12-18 | Discharge: 2019-12-18 | Disposition: A | Discharge status: Home / Self Care | Payer: BC Managed Care – PPO | Source: Ambulatory Visit | Attending: Family Medicine | Admitting: Family Medicine

## 2019-12-18 ENCOUNTER — Ambulatory Visit (INDEPENDENT_AMBULATORY_CARE_PROVIDER_SITE_OTHER): Payer: BC Managed Care – PPO | Admitting: Family Medicine

## 2019-12-18 VITALS — BP 124/78 | HR 80 | Temp 96.9°F | Resp 16 | Ht 64.0 in | Wt 146.2 lb

## 2019-12-18 DIAGNOSIS — Z113 Encounter for screening for infections with a predominantly sexual mode of transmission: Secondary | ICD-10-CM | POA: Diagnosis present

## 2019-12-18 DIAGNOSIS — B3731 Acute candidiasis of vulva and vagina: Secondary | ICD-10-CM

## 2019-12-18 DIAGNOSIS — N898 Other specified noninflammatory disorders of vagina: Secondary | ICD-10-CM

## 2019-12-18 DIAGNOSIS — R7303 Prediabetes: Secondary | ICD-10-CM

## 2019-12-18 DIAGNOSIS — B373 Candidiasis of vulva and vagina: Secondary | ICD-10-CM

## 2019-12-18 DIAGNOSIS — E559 Vitamin D deficiency, unspecified: Secondary | ICD-10-CM | POA: Diagnosis not present

## 2019-12-18 MED ORDER — FLUCONAZOLE 150 MG PO TABS: 150.0000 mg | ORAL_TABLET | ORAL | 0 refills | Status: DC

## 2019-12-18 NOTE — Progress Notes (Signed)
Name: Amber Hardin   MRN: [id number]    DOB: 1966/10/19   Date:12/18/2019       Progress Note  Subjective  Chief Complaint  Chief Complaint  Patient presents with  . Follow-up  . Weight Loss    Working out 3x weekly, 60 min sessions, weights, cardio and circuit exercises. Eating 1 snack and 3 meals daily  . Allergic Rhinitis     Doing better with wearing mask    HPI  Vaginal odor: she states she noticed an odor - fish and ammonia smell. She states very mild itching, but no pain, discomfort or discharge She is not douching, no change hygiene products. She is sexually active , last intercourse was 2 weeks ago. She uses condoms   Menorrhagia: she has an IUD, placed Fec 13 th, 2019, she only has spotting now   Pre-diabetes: last A1C back to normal, losing weight, exercising and eats healthy - cooking at home . She denies polyphagia, polydipsia or polyuria   AR: she states since she has been wearing masks when out of the house, she still takes Allegra   Patient Active Problem List   Diagnosis Date Noted  . Elevated hemoglobin A1c 08/07/2019  . Menometrorrhagia 12/29/2017  . Allergic rhinitis 06/22/2015  . 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 Nonspecific reaction to tuberculin skin test 06/22/2015  . Vitamin D deficiency 06/22/2015    History reviewed. No pertinent surgical history.  Family History  Problem Relation Age of Onset  . Diabetes Mother   . Breast cancer Mother 72  . Cancer Mother   . Dementia Mother   . Heart disease Father   . Heart attack Father 25  . COPD Father   . Diabetes Father   . Diabetes Sister        Middle Sister  . Hypertension Sister        Older Sister  . Thyroid disease Sister        Older Sister  . Breast cancer Maternal Grandmother 35  . Cancer Maternal Grandfather      Current Outpatient Medications:  .  BIOTIN PO, Take 2 each by mouth daily., Disp: , Rfl:  .  Cholecalciferol (VITAMIN D3 GUMMIES ADULT PO), Take 2 each by  mouth daily., Disp: , Rfl:  .  fexofenadine (ALLEGRA) 180 MG tablet, Take 1 tablet by mouth daily., Disp: , Rfl:  .  Multiple Vitamins-Minerals (MULTIVITAL) CHEW, Chew 1 tablet by mouth daily., Disp: , Rfl:  .  fluconazole (DIFLUCAN) 150 MG tablet, Take 1 tablet (150 mg total) by mouth every other day. For 3 days prn (Patient not taking: Reported on 12/18/2019), Disp: 2 tablet, Rfl: 0 .  levonorgestrel (MIRENA) 20 MCG/24HR IUD, 1 Intra Uterine Device (1 each total) by Intrauterine route once for 1 dose., Disp: 1 each, Rfl: 0 .  phenazopyridine (PYRIDIUM) 100 MG tablet, Take 1 tablet (100 mg total) by mouth 3 (three) times daily as needed for pain. (Patient not taking: Reported on 12/18/2019), Disp: 12 tablet, Rfl: 0  Allergies  Allergen Reactions  . Latex   . Penicillin G Rash    I personally reviewed active problem list, medication list, allergies, family history, social history, health maintenance with the patient/caregiver today.   ROS  Constitutional: Negative for fever or weight change.  Respiratory: Negative for cough and shortness of breath.   Cardiovascular: Negative for chest pain or palpitations.  Gastrointestinal: Negative for abdominal pain, no bowel changes.  Musculoskeletal: Negative for gait problem  or joint swelling.  Skin: Negative for rash.  Neurological: Negative for dizziness or headache.  No other specific complaints in a complete review of systems (except as listed in HPI above).  Objective  Vitals:   12/18/19 1337  Pulse: 80  Resp: 16  Temp: (!) 96.9 F (36.1 C)  TempSrc: Temporal  SpO2: 99%  Weight: 146 lb 3.2 oz (66.3 kg)  Height: 5\' 4"  (1.626 m)    Body mass index is 25.1 kg/m.  Physical Exam  Constitutional: Patient appears well-developed and well-nourished.  No distress.  HEENT: head atraumatic, normocephalic, pupils equal and reactive to light Cardiovascular: Normal rate, regular rhythm and normal heart sounds.  No murmur heard. No BLE  edema. Pulmonary/Chest: Effort normal and breath sounds normal. No respiratory distress. Abdominal: Soft.  There is no tenderness. GYN: very mild yellow discharge, no odor, cervix was normal, no pain or masses during bimanual exam, string of IUD very short Psychiatric: Patient has a normal mood and affect. behavior is normal. Judgment and thought content normal.  PHQ2/9: Depression screen [facility] 2/9 12/18/2019 07/07/2019 07/05/2018 12/16/2017 10/15/2017  Decreased Interest 0 0 0 0 0  Down, Depressed, Hopeless 0 0 0 0 0  PHQ - 2 Score 0 0 0 0 0  Altered sleeping 0 0 - - -  Tired, decreased energy 0 0 - - -  Change in appetite 0 0 - - -  Feeling bad or failure about yourself  0 0 - - -  Trouble concentrating 0 0 - - -  Moving slowly or fidgety/restless 0 0 - - -  Suicidal thoughts 0 0 - - -  PHQ-9 Score 0 0 - - -  Difficult doing work/chores Not difficult at all - - - -    phq 9 is negative   Fall Risk: Fall Risk  12/18/2019 07/05/2018 12/16/2017 10/15/2017 09/14/2017  Falls in the past year? 0 No No No No  Number falls in past yr: 0 - - - -  Injury with Fall? 0 - - - -     Functional Status Survey: Is the patient deaf or have difficulty hearing?: No Does the patient have difficulty seeing, even when wearing glasses/contacts?: No Does the patient have difficulty concentrating, remembering, or making decisions?: No Does the patient have difficulty walking or climbing stairs?: No Does the patient have difficulty dressing or bathing?: No Does the patient have difficulty doing errands alone such as visiting a doctor's office or shopping?: No    Assessment & Plan  1. Vaginal odor  - Cervicovaginal ancillary only  2. Pre-diabetes  Continue life style modification   3. Vitamin D deficiency  Continue vitamin D supplementation   4. Routine screening for STI (sexually transmitted infection)  - Cervicovaginal ancillary only   5.  Recurrent candida vaginitis  - fluconazole  (DIFLUCAN) 150 MG tablet; Take 1 tablet (150 mg total) by mouth every other day. For 3 days prn  Dispense: 2 tablet; Refill: 0

## 2019-12-20 ENCOUNTER — Other Ambulatory Visit: Payer: Self-pay | Admitting: Family Medicine

## 2019-12-20 LAB — CERVICOVAGINAL ANCILLARY ONLY
Bacterial Vaginitis (gardnerella): POSITIVE — AB
Candida Glabrata: NEGATIVE
Candida Vaginitis: NEGATIVE
Chlamydia: NEGATIVE
Comment: NEGATIVE
Comment: NEGATIVE
Comment: NEGATIVE
Comment: NEGATIVE
Comment: NEGATIVE
Comment: NORMAL
Neisseria Gonorrhea: NEGATIVE
Trichomonas: NEGATIVE

## 2019-12-20 MED ORDER — METRONIDAZOLE 500 MG PO TABS: 500.0000 mg | ORAL_TABLET | Freq: Two times a day (BID) | ORAL | 0 refills | Status: DC

## 2020-07-05 NOTE — Patient Instructions (Signed)

## 2020-07-05 NOTE — Progress Notes (Signed)
Name: Amber Hardin   MRN: [id number]    DOB: 1966-10-01   Date:07/08/2020       Progress Note  Subjective  Chief Complaint  Chief Complaint  Patient presents with  . Annual Exam    HPI  Patient presents for annual CPE.  She has recurrent yeast infection. She states over the weekend she had itching and cottage cheese discharge, did not use otc medication, still has some discomfort   Discussed shingrix but she wants to hold off for now She will get flu vaccine at work this October and will send me a copy   Diet: balanced meals  Exercise: Amber Hardin     Office Visit from 07/08/2020 in [facility]  AUDIT-C Score 0     Depression: Phq 9 is  negative Depression screen [facility] 2/9 07/08/2020 12/18/2019 07/07/2019 07/05/2018 12/16/2017  Decreased Interest 0 0 0 0 0  Down, Depressed, Hopeless 0 0 0 0 0  PHQ - 2 Score 0 0 0 0 0  Altered sleeping 0 0 0 - -  Tired, decreased energy 0 0 0 - -  Change in appetite 0 0 0 - -  Feeling bad or failure about yourself  0 0 0 - -  Trouble concentrating 0 0 0 - -  Moving slowly or fidgety/restless 0 0 0 - -  Suicidal thoughts 0 0 0 - -  PHQ-9 Score 0 0 0 - -  Difficult doing work/chores - Not difficult at all - - -   Hypertension: BP Readings from Last 3 Encounters:  07/08/20 118/80  12/18/19 124/78  07/07/19 110/70   Obesity: Wt Readings from Last 3 Encounters:  07/08/20 143 lb 12.8 oz (65.2 kg)  12/18/19 146 lb 3.2 oz (66.3 kg)  07/07/19 150 lb 8 oz (68.3 kg)   BMI Readings from Last 3 Encounters:  07/08/20 24.88 kg/m  12/18/19 25.10 kg/m  07/07/19 25.83 kg/m     Hep C Screening: 07/05/2018  STD testing and prevention (HIV/chl/gon/syphilis): she is interested  Intimate partner violence: negative screen  Sexual History : she has an IUD, spotting  Menstrual History/LMP/Abnormal Bleeding: skips months and spots  Incontinence Symptoms: no problems   Breast cancer:  - Last Mammogram: 11/2019 - BRCA gene  screening: not interested   Osteoporosis: Discussed high calcium and vitamin D supplementation, weight bearing exercises  Cervical cancer screening: 06/2017  Skin cancer: Discussed monitoring for atypical lesions  Colorectal cancer: repeat cologuard 2022    Lung cancer:   Low Dose CT Chest recommended if Age 62-80 years, 30 pack-year currently smoking OR have quit w/in 15years. Patient does not qualify.   ECG: not interested   Advanced Care Planning: A voluntary discussion about advance care planning including the explanation and discussion of advance directives as cousin Amber Hardin .  Discussed health care proxy and Living will, and the patient was able to identify a health care proxy as   Patient does not have a living will at present time.   Lipids: Lab Results  Component Value Date   CHOL 176 07/07/2019   CHOL 170 07/05/2018   CHOL 192 07/02/2017   Lab Results  Component Value Date   HDL 57 07/07/2019   HDL 53 07/05/2018   HDL 61 07/02/2017   Lab Results  Component Value Date   LDLCALC 101 (H) 07/07/2019   LDLCALC 98 07/05/2018   LDLCALC 112 (H) 07/02/2017   Lab Results  Component Value Date   TRIG 88  07/07/2019   TRIG 91 07/05/2018   TRIG 93 07/02/2017   Lab Results  Component Value Date   CHOLHDL 3.1 07/07/2019   CHOLHDL 3.2 07/05/2018   CHOLHDL 3.1 07/02/2017   No results found for: LDLDIRECT  Glucose: Glucose, Bld  Date Value Ref Range Status  07/07/2019 99 65 - 99 mg/dL Final    Comment:    .            Fasting reference interval .   07/05/2018 97 65 - 99 mg/dL Final    Comment:    .            Fasting reference interval .   07/02/2017 94 65 - 99 mg/dL Final    Patient Active Problem List   Diagnosis Date Noted  . Elevated hemoglobin A1c 08/07/2019  . Menometrorrhagia 12/29/2017  . Allergic rhinitis 06/22/2015  . 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 Nonspecific reaction to tuberculin skin test 06/22/2015  . Vitamin D deficiency  06/22/2015    History reviewed. No pertinent surgical history.  Family History  Problem Relation Age of Onset  . Diabetes Mother   . Breast cancer Mother 75  . Cancer Mother   . Dementia Mother   . Heart disease Father   . Heart attack Father 56  . COPD Father   . Diabetes Father   . Diabetes Sister        Middle Sister  . Hypertension Sister        Older Sister  . Thyroid disease Sister        Older Sister  . Breast cancer Maternal Grandmother 15  . Cancer Maternal Grandmother   . Cancer Maternal Grandfather     Social History   Socioeconomic History  . Marital status: Divorced    Spouse name: Not on file  . Number of children: 0  . Years of education: Not on file  . Highest education level: Not on file  Occupational History  . Occupation: business Theme park manager     Comment: degree in social work - works in [place] Use  . Smoking status: Never Smoker  . Smokeless tobacco: Never Used  Vaping Use  . Vaping Use: Never used  Substance and Sexual Activity  . Alcohol use: Not Currently    Alcohol/week: 0.0 standard drinks    Comment: socially  . Drug use: No  . Sexual activity: Yes    Partners: Male    Birth control/protection: Condom, I.U.D.  Other Topics Concern  . 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 and works in [employer] and independent. Currently dating    Social Determinants of Health   Financial Resource Strain: Low Risk   . Difficulty of Paying Living Expenses: Not hard at all  Food Insecurity: No Food Insecurity  . Worried About Charity fundraiser in the Last Year: Never true  . Ran Out of Food in the Last Year: Never true  Transportation Needs: No Transportation Needs  . Lack of Transportation (Medical): No  . Lack of Transportation (Non-Medical): No  Physical  Activity: Sufficiently Active  . Days of Exercise per Week: 3 days  . Minutes of Exercise per Session: 60 min  Stress: No Stress Concern Present  . Feeling of Stress :  Not at all  Social Connections: Moderately Integrated  . Frequency of Communication with Friends and Family: More than three times a week  . Frequency of Social Gatherings with Friends and Family: More than three times a week  . Attends Religious Services: More than 4 times per year  . Active Member of Clubs or Organizations: Yes  . Attends Archivist Meetings: 1 to 4 times per year  . Marital Status: Divorced  Human resources officer Violence: Not At Risk  . Fear of Current or Ex-Partner: No  . Emotionally Abused: No  . Physically Abused: No  . Sexually Abused: No     Current Outpatient Medications:  .  BIOTIN PO, Take 2 each by mouth daily., Disp: , Rfl:  .  Cholecalciferol (VITAMIN D3 GUMMIES ADULT PO), Take 2 each by mouth daily., Disp: , Rfl:  .  fexofenadine (ALLEGRA) 180 MG tablet, Take 1 tablet by mouth daily., Disp: , Rfl:  .  fluconazole (DIFLUCAN) 150 MG tablet, Take 1 tablet (150 mg total) by mouth every other day. For 3 days prn, Disp: 2 tablet, Rfl: 0 .  Multiple Vitamins-Minerals (MULTIVITAL) CHEW, Chew 1 tablet by mouth daily., Disp: , Rfl:  .  levonorgestrel (MIRENA) 20 MCG/24HR IUD, 1 Intra Uterine Device (1 each total) by Intrauterine route once for 1 dose., Disp: 1 each, Rfl: 0  Allergies  Allergen Reactions  . Latex   . Penicillin G Rash     Review of Systems  Constitutional: Negative for chills and fever.  HENT: Negative for congestion and hearing loss.   Eyes: Negative for blurred vision.  Respiratory: Negative for cough.   Cardiovascular: Negative for chest pain and palpitations.  Gastrointestinal: Negative for abdominal pain.  Genitourinary: Negative for dysuria, frequency and urgency.  Musculoskeletal: Negative for myalgias.  Neurological: Negative for dizziness and headaches.   Psychiatric/Behavioral: Negative for suicidal ideas.  All other systems reviewed and are negative.    Objective  Vitals:   07/08/20 0818 07/08/20 0835  BP: 140/80 118/80  Pulse: 73   Resp: 16   Temp: 97.7 F (36.5 C)   TempSrc: Oral   SpO2: 98%   Weight: 143 lb 12.8 oz (65.2 kg)   Height: 5' 3.75" (1.619 m)     Body mass index is 24.88 kg/m.  Constitutional: Patient appears well-developed and well-nourished. No distress.  HENT: Head: Normocephalic and atraumatic. Ears: B TMs ok, no erythema or effusion; Nose: Nose normal. Mouth/Throat: Oropharynx is clear and moist. No oropharyngeal exudate.  Eyes: Conjunctivae and EOM are normal. Pupils are equal, round, and reactive to light. No scleral icterus.  Neck: Normal range of motion. Neck supple. No JVD present. No thyromegaly present.  Cardiovascular: Normal rate, regular rhythm and normal heart sounds.  No murmur heard. No BLE edema. Pulmonary/Chest: Effort normal and breath sounds normal. No respiratory distress. Abdominal: Soft. Bowel sounds are normal, no distension. There is no tenderness. no masses Breast: no lumps or masses, no nipple discharge or rashes FEMALE GENITALIA:  External genitalia normal External urethra normal Vaginal vault normal without discharge or lesions Cervix normal without discharge or lesions Bimanual exam normal without masses RECTAL: not done  Musculoskeletal: Normal range of motion, no joint effusions. No gross deformities Neurological: he is alert and oriented to person, place, and time. No cranial nerve deficit. Coordination, balance, strength, speech and gait are normal.  Skin: Skin is warm and dry. No rash noted. No erythema.  Psychiatric: Patient has a normal Hardin and affect. behavior  is normal. Judgment and thought content normal.   Fall Risk: Fall Risk  07/08/2020 12/18/2019 07/05/2018 12/16/2017 10/15/2017  Falls in the past year? 0 0 No No No  Number falls in past yr: 0 0 - - -  Injury  with Fall? 0 0 - - -     Assessment & Plan  1. Well woman exam  - CBC - Comprehensive metabolic panel  2. Elevated hemoglobin A1c  Recheck level  3. Recurrent candidiasis of vagina  - fluconazole (DIFLUCAN) 150 MG tablet; Take 1 tablet (150 mg total) by mouth every other day. For 3 days prn  Dispense: 2 tablet; Refill: 0  4. Vitamin D deficiency   5. Pre-diabetes  - Hemoglobin A1c  6. Yeast vaginitis  Diflucan sent   7. Encounter for screening mammogram for malignant neoplasm of breast  - MM 3D SCREEN BREAST BILATERAL; Future  8. Lipid screening  - Lipid panel  9. Cervical cancer screening  - Cytology - PAP  10. Routine screening for STI (sexually transmitted infection)  - RPR - HIV Antibody (routine testing w rflx)  -USPSTF grade A and B recommendations reviewed with patient; age-appropriate recommendations, preventive care, screening tests, etc discussed and encouraged; healthy living encouraged; see AVS for patient education given to patient -Discussed importance of 150 minutes of physical activity weekly, eat two servings of fish weekly, eat one serving of tree nuts ( cashews, pistachios, pecans, almonds.Marland Kitchen) every other day, eat 6 servings of fruit/vegetables daily and drink plenty of water and avoid sweet beverages.

## 2020-07-08 ENCOUNTER — Other Ambulatory Visit (HOSPITAL_COMMUNITY)
Admission: RE | Admit: 2020-07-08 | Discharge: 2020-07-08 | Disposition: A | Discharge status: Home / Self Care | Payer: BC Managed Care – PPO | Source: Ambulatory Visit | Attending: Family Medicine | Admitting: Family Medicine

## 2020-07-08 ENCOUNTER — Encounter: Payer: Self-pay | Admitting: Family Medicine

## 2020-07-08 ENCOUNTER — Ambulatory Visit (INDEPENDENT_AMBULATORY_CARE_PROVIDER_SITE_OTHER): Payer: BC Managed Care – PPO | Admitting: Family Medicine

## 2020-07-08 ENCOUNTER — Other Ambulatory Visit: Payer: Self-pay

## 2020-07-08 VITALS — BP 118/80 | HR 73 | Temp 97.7°F | Resp 16 | Ht 63.75 in | Wt 143.8 lb

## 2020-07-08 DIAGNOSIS — B3731 Acute candidiasis of vulva and vagina: Secondary | ICD-10-CM

## 2020-07-08 DIAGNOSIS — E559 Vitamin D deficiency, unspecified: Secondary | ICD-10-CM | POA: Diagnosis not present

## 2020-07-08 DIAGNOSIS — Z113 Encounter for screening for infections with a predominantly sexual mode of transmission: Secondary | ICD-10-CM

## 2020-07-08 DIAGNOSIS — Z1231 Encounter for screening mammogram for malignant neoplasm of breast: Secondary | ICD-10-CM

## 2020-07-08 DIAGNOSIS — Z01419 Encounter for gynecological examination (general) (routine) without abnormal findings: Secondary | ICD-10-CM | POA: Insufficient documentation

## 2020-07-08 DIAGNOSIS — R7309 Other abnormal glucose: Secondary | ICD-10-CM

## 2020-07-08 DIAGNOSIS — B373 Candidiasis of vulva and vagina: Secondary | ICD-10-CM | POA: Diagnosis not present

## 2020-07-08 DIAGNOSIS — Z13220 Encounter for screening for lipoid disorders: Secondary | ICD-10-CM

## 2020-07-08 DIAGNOSIS — Z124 Encounter for screening for malignant neoplasm of cervix: Secondary | ICD-10-CM

## 2020-07-08 DIAGNOSIS — Z23 Encounter for immunization: Secondary | ICD-10-CM

## 2020-07-08 DIAGNOSIS — R7303 Prediabetes: Secondary | ICD-10-CM

## 2020-07-08 MED ORDER — FLUCONAZOLE 150 MG PO TABS: 150.0000 mg | ORAL_TABLET | ORAL | 0 refills | Status: DC

## 2020-07-09 LAB — COMPREHENSIVE METABOLIC PANEL
AG Ratio: 1.6 (calc) (ref 1.0–2.5)
ALT: 12 U/L (ref 6–29)
AST: 16 U/L (ref 10–35)
Albumin: 4.5 g/dL (ref 3.6–5.1)
Alkaline phosphatase (APISO): 59 U/L (ref 37–153)
BUN: 13 mg/dL (ref 7–25)
CO2: 27 mmol/L (ref 20–32)
Calcium: 9.7 mg/dL (ref 8.6–10.4)
Chloride: 100 mmol/L (ref 98–110)
Creat: 0.88 mg/dL (ref 0.50–1.05)
Globulin: 2.9 g/dL (calc) (ref 1.9–3.7)
Glucose, Bld: 94 mg/dL (ref 65–99)
Potassium: 3.9 mmol/L (ref 3.5–5.3)
Sodium: 136 mmol/L (ref 135–146)
Total Bilirubin: 1.9 mg/dL — ABNORMAL HIGH (ref 0.2–1.2)
Total Protein: 7.4 g/dL (ref 6.1–8.1)

## 2020-07-09 LAB — CBC
HCT: 45.3 % — ABNORMAL HIGH (ref 35.0–45.0)
Hemoglobin: 14.8 g/dL (ref 11.7–15.5)
MCH: 28 pg (ref 27.0–33.0)
MCHC: 32.7 g/dL (ref 32.0–36.0)
MCV: 85.6 fL (ref 80.0–100.0)
MPV: 12.1 fL (ref 7.5–12.5)
Platelets: 248 10*3/uL (ref 140–400)
RBC: 5.29 10*6/uL — ABNORMAL HIGH (ref 3.80–5.10)
RDW: 12.6 % (ref 11.0–15.0)
WBC: 8.1 10*3/uL (ref 3.8–10.8)

## 2020-07-09 LAB — HIV ANTIBODY (ROUTINE TESTING W REFLEX): HIV 1&2 Ab, 4th Generation: NONREACTIVE

## 2020-07-09 LAB — RPR: RPR Ser Ql: NONREACTIVE

## 2020-07-10 LAB — CYTOLOGY - PAP
Adequacy: ABSENT
Chlamydia: NEGATIVE
Comment: NEGATIVE
Comment: NEGATIVE
Comment: NEGATIVE
Comment: NORMAL
Diagnosis: NEGATIVE
High risk HPV: POSITIVE — AB
Neisseria Gonorrhea: NEGATIVE
Trichomonas: NEGATIVE

## 2020-09-11 ENCOUNTER — Encounter: Payer: Self-pay | Admitting: Family Medicine

## 2020-09-19 ENCOUNTER — Encounter: Payer: Self-pay | Admitting: Family Medicine

## 2020-10-07 NOTE — Progress Notes (Signed)
Name: Amber Hardin   MRN: [id number]    DOB: Nov 07, 1966   Date:10/08/2020       Progress Note  Subjective  Chief Complaint  Acute/Yeast infection  HPI  Vaginal odor: she had an episode of BV back in Feb 2021, she was doing well, however over the past week she has noticed some wetness on her underwear, "fishy", "clorox" "vinegar" odor and would like to be checked again. She states ended her relationship and would like to be checked for STI   Patient Active Problem List   Diagnosis Date Noted  . Elevated hemoglobin A1c 08/07/2019  . Menometrorrhagia 12/29/2017  . Allergic rhinitis 06/22/2015  . 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 Nonspecific reaction to tuberculin skin test 06/22/2015  . Vitamin D deficiency 06/22/2015    History reviewed. No pertinent surgical history.  Family History  Problem Relation Age of Onset  . Diabetes Mother   . Breast cancer Mother 14  . Cancer Mother   . Dementia Mother   . Heart disease Father   . Heart attack Father 58  . COPD Father   . Diabetes Father   . Diabetes Sister        Middle Sister  . Hypertension Sister        Older Sister  . Thyroid disease Sister        Older Sister  . Breast cancer Maternal Grandmother 50  . Cancer Maternal Grandmother   . Cancer Maternal Grandfather     Social History   Tobacco Use  . Smoking status: Never Smoker  . Smokeless tobacco: Never Used  Substance Use Topics  . Alcohol use: Not Currently    Alcohol/week: 0.0 standard drinks    Comment: socially     Current Outpatient Medications:  .  BIOTIN PO, Take 2 each by mouth daily., Disp: , Rfl:  .  Cholecalciferol (VITAMIN D3 GUMMIES ADULT PO), Take 2 each by mouth daily., Disp: , Rfl:  .  fexofenadine (ALLEGRA) 180 MG tablet, Take 1 tablet by mouth daily., Disp: , Rfl:  .  fluconazole (DIFLUCAN) 150 MG tablet, Take 1 tablet (150 mg total) by mouth every other day. For 3 days prn, Disp: 2 tablet, Rfl: 0 .  Multiple Vitamins-Minerals  (MULTIVITAL) CHEW, Chew 1 tablet by mouth daily., Disp: , Rfl:  .  levonorgestrel (MIRENA) 20 MCG/24HR IUD, 1 Intra Uterine Device (1 each total) by Intrauterine route once for 1 dose., Disp: 1 each, Rfl: 0 .  metroNIDAZOLE (FLAGYL) 500 MG tablet, Take 1 tablet (500 mg total) by mouth 2 (two) times daily., Disp: 14 tablet, Rfl: 1  Allergies  Allergen Reactions  . Latex   . Penicillin G Rash    I personally reviewed active problem list, medication list, allergies, family history, social history, health maintenance with the patient/caregiver today.   ROS  Ten systems reviewed and is negative except as mentioned in HPI   Objective  Vitals:   10/08/20 1353  BP: 116/82  Pulse: 90  Resp: 16  Temp: 98.2 F (36.8 C)  TempSrc: Oral  SpO2: 93%  Weight: 150 lb 12.8 oz (68.4 kg)  Height: 5\' 4"  (1.626 m)    Body mass index is 25.88 kg/m.  Physical Exam  Constitutional: Patient appears well-developed and well-nourished. No distress.  HEENT: head atraumatic, normocephalic, pupils equal and reactive to light, neck supple GYN:  Cardiovascular: Normal rate, regular rhythm and normal heart sounds.  No murmur heard. No BLE edema. Pulmonary/Chest: Effort normal  and breath sounds normal. No respiratory distress. Abdominal: Soft.  There is no tenderness. GYN: watery vaginal discharge Psychiatric: Patient has a normal mood and affect. behavior is normal. Judgment and thought content normal.  PHQ2/9: Depression screen [facility] 2/9 10/08/2020 07/08/2020 12/18/2019 07/07/2019 07/05/2018  Decreased Interest 0 0 0 0 0  Down, Depressed, Hopeless 0 0 0 0 0  PHQ - 2 Score 0 0 0 0 0  Altered sleeping - 0 0 0 -  Tired, decreased energy - 0 0 0 -  Change in appetite - 0 0 0 -  Feeling bad or failure about yourself  - 0 0 0 -  Trouble concentrating - 0 0 0 -  Moving slowly or fidgety/restless - 0 0 0 -  Suicidal thoughts - 0 0 0 -  PHQ-9 Score - 0 0 0 -  Difficult doing work/chores - - Not difficult at  all - -    phq 9 is negative   Fall Risk: Fall Risk  10/08/2020 07/08/2020 12/18/2019 07/05/2018 12/16/2017  Falls in the past year? 0 0 0 No No  Number falls in past yr: 0 0 0 - -  Injury with Fall? 0 0 0 - -    Functional Status Survey: Is the patient deaf or have difficulty hearing?: No Does the patient have difficulty seeing, even when wearing glasses/contacts?: No Does the patient have difficulty concentrating, remembering, or making decisions?: No Does the patient have difficulty walking or climbing stairs?: No Does the patient have difficulty dressing or bathing?: No Does the patient have difficulty doing errands alone such as visiting a doctor's office or shopping?: No   Assessment & Plan  1. Vaginal odor  - metroNIDAZOLE (FLAGYL) 500 MG tablet; Take 1 tablet (500 mg total) by mouth 2 (two) times daily.  Dispense: 14 tablet; Refill: 1 - Cervicovaginal ancillary only  2. Routine screening for STI (sexually transmitted infection)  - Cervicovaginal ancillary only - RPR - HIV Antibody (routine testing w rflx)

## 2020-10-08 ENCOUNTER — Other Ambulatory Visit (HOSPITAL_COMMUNITY)
Admission: RE | Admit: 2020-10-08 | Discharge: 2020-10-08 | Disposition: A | Discharge status: Home / Self Care | Payer: BC Managed Care – PPO | Source: Ambulatory Visit | Attending: Family Medicine | Admitting: Family Medicine

## 2020-10-08 ENCOUNTER — Ambulatory Visit: Payer: BC Managed Care – PPO | Admitting: Family Medicine

## 2020-10-08 ENCOUNTER — Other Ambulatory Visit: Payer: Self-pay

## 2020-10-08 ENCOUNTER — Encounter: Payer: Self-pay | Admitting: Family Medicine

## 2020-10-08 VITALS — BP 116/82 | HR 90 | Temp 98.2°F | Resp 16 | Ht 64.0 in | Wt 150.8 lb

## 2020-10-08 DIAGNOSIS — Z113 Encounter for screening for infections with a predominantly sexual mode of transmission: Secondary | ICD-10-CM

## 2020-10-08 DIAGNOSIS — R7989 Other specified abnormal findings of blood chemistry: Secondary | ICD-10-CM

## 2020-10-08 DIAGNOSIS — N898 Other specified noninflammatory disorders of vagina: Secondary | ICD-10-CM | POA: Diagnosis not present

## 2020-10-08 MED ORDER — METRONIDAZOLE 500 MG PO TABS: 500.0000 mg | ORAL_TABLET | Freq: Two times a day (BID) | ORAL | 1 refills | Status: DC

## 2020-10-09 LAB — CBC WITH DIFFERENTIAL/PLATELET
Absolute Monocytes: 640 cells/uL (ref 200–950)
Basophils Absolute: 62 cells/uL (ref 0–200)
Basophils Relative: 0.8 %
Eosinophils Absolute: 499 cells/uL (ref 15–500)
Eosinophils Relative: 6.4 %
HCT: 44.4 % (ref 35.0–45.0)
Hemoglobin: 14.2 g/dL (ref 11.7–15.5)
Lymphs Abs: 3104 cells/uL (ref 850–3900)
MCH: 26.5 pg — ABNORMAL LOW (ref 27.0–33.0)
MCHC: 32 g/dL (ref 32.0–36.0)
MCV: 83 fL (ref 80.0–100.0)
MPV: 11.9 fL (ref 7.5–12.5)
Monocytes Relative: 8.2 %
Neutro Abs: 3494 cells/uL (ref 1500–7800)
Neutrophils Relative %: 44.8 %
Platelets: 282 10*3/uL (ref 140–400)
RBC: 5.35 10*6/uL — ABNORMAL HIGH (ref 3.80–5.10)
RDW: 12.2 % (ref 11.0–15.0)
Total Lymphocyte: 39.8 %
WBC: 7.8 10*3/uL (ref 3.8–10.8)

## 2020-10-09 LAB — HIV ANTIBODY (ROUTINE TESTING W REFLEX): HIV 1&2 Ab, 4th Generation: NONREACTIVE

## 2020-10-09 LAB — RPR: RPR Ser Ql: NONREACTIVE

## 2020-10-11 LAB — CERVICOVAGINAL ANCILLARY ONLY
Bacterial Vaginitis (gardnerella): POSITIVE — AB
Candida Glabrata: NEGATIVE
Candida Vaginitis: NEGATIVE
Chlamydia: NEGATIVE
Comment: NEGATIVE
Comment: NEGATIVE
Comment: NEGATIVE
Comment: NEGATIVE
Comment: NEGATIVE
Comment: NORMAL
Neisseria Gonorrhea: NEGATIVE
Trichomonas: NEGATIVE

## 2020-10-24 ENCOUNTER — Other Ambulatory Visit: Payer: Self-pay | Admitting: Family Medicine

## 2020-10-24 ENCOUNTER — Encounter: Payer: Self-pay | Admitting: Family Medicine

## 2020-10-24 DIAGNOSIS — B373 Candidiasis of vulva and vagina: Secondary | ICD-10-CM

## 2020-10-24 DIAGNOSIS — B3731 Acute candidiasis of vulva and vagina: Secondary | ICD-10-CM

## 2020-10-24 MED ORDER — FLUCONAZOLE 150 MG PO TABS: 150.0000 mg | ORAL_TABLET | ORAL | 0 refills | Status: DC

## 2020-11-12 ENCOUNTER — Encounter: Payer: Self-pay | Admitting: Family Medicine

## 2020-11-15 ENCOUNTER — Other Ambulatory Visit: Payer: Self-pay | Admitting: Family Medicine

## 2020-11-15 DIAGNOSIS — B373 Candidiasis of vulva and vagina: Secondary | ICD-10-CM

## 2020-11-15 DIAGNOSIS — B3731 Acute candidiasis of vulva and vagina: Secondary | ICD-10-CM

## 2020-11-15 MED ORDER — FLUCONAZOLE 150 MG PO TABS: 150.0000 mg | ORAL_TABLET | ORAL | 0 refills | Status: DC

## 2020-12-05 ENCOUNTER — Other Ambulatory Visit: Payer: Self-pay

## 2020-12-05 ENCOUNTER — Ambulatory Visit
Admission: RE | Admit: 2020-12-05 | Discharge: 2020-12-05 | Disposition: A | Discharge status: Home / Self Care | Payer: BC Managed Care – PPO | Source: Ambulatory Visit | Attending: Family Medicine | Admitting: Family Medicine

## 2020-12-05 DIAGNOSIS — Z1231 Encounter for screening mammogram for malignant neoplasm of breast: Secondary | ICD-10-CM | POA: Diagnosis present

## 2020-12-10 ENCOUNTER — Other Ambulatory Visit: Payer: Self-pay

## 2020-12-10 ENCOUNTER — Ambulatory Visit (INDEPENDENT_AMBULATORY_CARE_PROVIDER_SITE_OTHER): Payer: BC Managed Care – PPO | Admitting: Obstetrics and Gynecology

## 2020-12-10 ENCOUNTER — Encounter: Payer: Self-pay | Admitting: Obstetrics and Gynecology

## 2020-12-10 VITALS — BP 140/90 | Ht 64.0 in | Wt 154.0 lb

## 2020-12-10 DIAGNOSIS — B373 Candidiasis of vulva and vagina: Secondary | ICD-10-CM | POA: Diagnosis not present

## 2020-12-10 DIAGNOSIS — B3731 Acute candidiasis of vulva and vagina: Secondary | ICD-10-CM

## 2020-12-10 LAB — POCT WET PREP WITH KOH
Clue Cells Wet Prep HPF POC: NEGATIVE
KOH Prep POC: NEGATIVE
Trichomonas, UA: NEGATIVE
Yeast Wet Prep HPF POC: POSITIVE

## 2020-12-10 MED ORDER — FLUCONAZOLE 150 MG PO TABS: 150.0000 mg | ORAL_TABLET | Freq: Once | ORAL | 0 refills | Status: AC

## 2020-12-10 MED ORDER — FLUCONAZOLE 150 MG PO TABS: ORAL_TABLET | ORAL | 0 refills | Status: DC

## 2020-12-10 NOTE — Patient Instructions (Signed)
I value your feedback and you entrusting [place] with your care. If you get a [facility] patient survey, I would appreciate you taking the time to let us know about your experience today. Thank you!  HEALTHY VAGINAL HYGIENE  AVOID   Panytyhose Synthetic underwear (wear COTTON underwear)  Tight pants/jeans Thongs Pantyliners Scented soaps/shower gels (use Dove Sensitive Skin soap or water to clean) Bubble bath/bath bombs Scented detergents  ALL dryer sheets (line dry underwear if using them on your other clothing) Feminine sprays/douches  ADD probiotics daily,

## 2020-12-10 NOTE — Progress Notes (Signed)
Amber Cory, MD    Chief Complaint  Patient presents with  . Vaginal Discharge    Itching, irritation,  no odor recurrent for the past year    HPI:      Ms. Amber Hardin is a 55 y.o. G1P0010 whose LMP was No LMP recorded. (Menstrual status: Perimenopausal)., presents today for recurrent yeast vag sx for the past year. Has increased thick, white clumpy d/c with itching/irritation usually, although d/c not thick this time but still has irritation. No fishy odor. Sees PCP and is treated with diflucan for 2 doses. Has never done wkly tx for 3-6  Months. Pt doing everything right--dove sens skin soap, no dryer sheets, unscented detergent, cotton underwear, no thongs, no damp underwear, no douching/baths. Uses unscented baby wipes and takes probiotics. Not currently sex active but uses condoms when she is.  Has had BV on 6/21 and 11/21 culture with PCP.  Mirena placed 01/2018;  last pap 8/21 was neg cell and pos HPV DNA with PCP  Past Medical History:  Diagnosis Date  . Allergy    mild  . ASCUS with positive high risk HPV cervical   . Contraceptive surveillance   . 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 Night sweats   . Oligomenorrhea   . Vitamin D deficiency     History reviewed. No pertinent surgical history.  Family History  Problem Relation Age of Onset  . Diabetes Mother   . Breast cancer Mother 26  . Cancer Mother   . Dementia Mother   . Alzheimer's disease Mother   . Heart disease Father   . Heart attack Father 48  . COPD Father   . Diabetes Father   . Diabetes Sister        Middle Sister  . Hypertension Sister        Older Sister  . Thyroid disease Sister        Older Sister  . Breast cancer Maternal Grandmother 50  . Cancer Maternal Grandmother   . Cancer Maternal Grandfather     Social History   Socioeconomic History  . Marital status: Divorced    Spouse name: Not on file  . Number of children: 0  . Years of education: Not on file  .  Highest education level: Not on file  Occupational History  . Occupation: business Company secretary     Comment: degree in social work - works in [employer]   Tobacco Use  . Smoking status: Never Smoker  . Smokeless tobacco: Never Used  Vaping Use  . Vaping Use: Never used  Substance and Sexual Activity  . Alcohol use: Not Currently    Alcohol/week: 0.0 standard drinks    Comment: socially  . Drug use: No  . Sexual activity: Not Currently    Partners: Male    Birth control/protection: Condom, I.U.D.    Comment: Mirena  Other Topics Concern  . 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    Social Determinants of Health   Financial Resource Strain: Low Risk   . Difficulty of Paying Living Expenses: Not hard at all  Food Insecurity: No Food Insecurity  . Worried About Programme researcher, broadcasting/film/video in the Last Year: Never  true  . Ran Out of Food in the Last Year: Never true  Transportation Needs: No Transportation Needs  . Lack of Transportation (Medical): No  . Lack of Transportation (Non-Medical): No  Physical Activity: Sufficiently Active  . Days of Exercise per Week: 3 days  . Minutes of Exercise per Session: 60 min  Stress: No Stress Concern Present  . Feeling of Stress : Not at all  Social Connections: Moderately Integrated  . Frequency of Communication with Friends and Family: More than three times a week  . Frequency of Social Gatherings with Friends and Family: More than three times a week  . Attends Religious Services: More than 4 times per year  . Active Member of Clubs or Organizations: Yes  . Attends Banker Meetings: 1 to 4 times per year  . Marital Status: Divorced  Catering manager Violence: Not At Risk  . Fear of Current or Ex-Partner: No  . Emotionally  Abused: No  . Physically Abused: No  . Sexually Abused: No    Outpatient Medications Prior to Visit  Medication Sig Dispense Refill  . BIOTIN PO Take 2 each by mouth daily.    . Cholecalciferol (VITAMIN D3 GUMMIES ADULT PO) Take 2 each by mouth daily.    . fexofenadine (ALLEGRA) 180 MG tablet Take 1 tablet by mouth daily.    . Multiple Vitamins-Minerals (MULTIVITAL) CHEW Chew 1 tablet by mouth daily.    Marland Kitchen levonorgestrel (MIRENA) 20 MCG/24HR IUD 1 Intra Uterine Device (1 each total) by Intrauterine route once for 1 dose. 1 each 0  . fluconazole (DIFLUCAN) 150 MG tablet Take 1 tablet (150 mg total) by mouth every other day. For 3 days prn 2 tablet 0  . metroNIDAZOLE (FLAGYL) 500 MG tablet Take 1 tablet (500 mg total) by mouth 2 (two) times daily. 14 tablet 1   No facility-administered medications prior to visit.      ROS:  Review of Systems  Constitutional: Negative for fever, malaise/fatigue and weight loss.  Gastrointestinal: Negative for blood in stool, constipation, diarrhea, nausea and vomiting.  Genitourinary: Positive for vaginal discharge. Negative for dyspareunia, dysuria, flank pain, frequency, hematuria, urgency, vaginal bleeding and vaginal pain.  Musculoskeletal: Negative for back pain.  Skin: Negative for itching and rash.   BREAST: No symptoms   OBJECTIVE:   Vitals:  BP 140/90   Ht 5\' 4"  (1.626 m)   Wt 154 lb (69.9 kg)   BMI 26.43 kg/m   Physical Exam Vitals reviewed.  Constitutional:      Appearance: She is well-developed.  Pulmonary:     Effort: Pulmonary effort is normal.  Genitourinary:    General: Normal vulva.     Pubic Area: No rash.      Labia:        Right: No rash, tenderness or lesion.        Left: No rash, tenderness or lesion.      Vagina: Normal. No vaginal discharge, erythema or tenderness.     Cervix: Normal.     Uterus: Normal. Not enlarged and not tender.      Adnexa: Right adnexa normal and left adnexa normal.       Right: No  mass or tenderness.         Left: No mass or tenderness.       Comments: IUD STRINGS NOT IN CX OS (Normal for pt) Musculoskeletal:        General: Normal range of motion.     Cervical  back: Normal range of motion.  Skin:    General: Skin is warm and dry.  Neurological:     General: No focal deficit present.     Mental Status: She is alert and oriented to person, place, and time.  Psychiatric:        Mood and Affect: Mood normal.        Behavior: Behavior normal.        Thought Content: Thought content normal.        Judgment: Judgment normal.     Results: Results for orders placed or performed in visit on 12/10/20 (from the past 24 hour(s))  POCT Wet Prep with KOH     Status: Abnormal   Collection Time: 12/10/20  2:46 PM  Result Value Ref Range   Trichomonas, UA Negative    Clue Cells Wet Prep HPF POC neg    Epithelial Wet Prep HPF POC     Yeast Wet Prep HPF POC pos    Bacteria Wet Prep HPF POC     RBC Wet Prep HPF POC     WBC Wet Prep HPF POC     KOH Prep POC Negative Negative     Assessment/Plan: Candidal vaginitis - Plan: POCT Wet Prep with KOH, fluconazole (DIFLUCAN) 150 MG tablet, recurrent sx. Rx diflucan Q3 days for 3 doses, then once wkly for 3-6 months as preventive. Can do One Swab yeast panel culture if sx persist. Cont probiotics.    Meds ordered this encounter                             . fluconazole (DIFLUCAN) 150 MG tablet    Sig: Take 1 tab Q3 days for 3 doses, then once weekly as preventive for 3-6 months    Dispense:  30 tablet    Refill:  0    Order Specific Question:   Supervising Provider    Answer:   Nadara Mustard [[id number]]      Return if symptoms worsen or fail to improve.  Jannel Lynne B. Adelaide Pfefferkorn, PA-C 12/10/2020 2:52 PM

## 2021-01-08 ENCOUNTER — Ambulatory Visit: Payer: BC Managed Care – PPO | Admitting: Family Medicine

## 2021-02-19 ENCOUNTER — Ambulatory Visit: Payer: Self-pay | Admitting: Physician Assistant

## 2021-03-24 ENCOUNTER — Encounter: Payer: Self-pay | Admitting: Family Medicine

## 2021-06-13 ENCOUNTER — Other Ambulatory Visit: Payer: Self-pay | Admitting: Obstetrics and Gynecology

## 2021-06-13 ENCOUNTER — Encounter: Payer: Self-pay | Admitting: Obstetrics and Gynecology

## 2021-06-13 DIAGNOSIS — B3731 Acute candidiasis of vulva and vagina: Secondary | ICD-10-CM

## 2021-06-13 DIAGNOSIS — B373 Candidiasis of vulva and vagina: Secondary | ICD-10-CM

## 2021-10-22 ENCOUNTER — Other Ambulatory Visit: Payer: Self-pay | Admitting: Family Medicine

## 2021-10-22 DIAGNOSIS — Z1231 Encounter for screening mammogram for malignant neoplasm of breast: Secondary | ICD-10-CM

## 2021-10-22 NOTE — Telephone Encounter (Signed)
Copied from CRM [id number]. Topic: General - Other >> Oct 22, 2021 10:02 AM Fanny Bien wrote: Reason for CRM: Pt called and stated that she would like to know if she can come into the office and get the shingles vaccine. Please advise

## 2021-10-27 ENCOUNTER — Other Ambulatory Visit: Payer: Self-pay

## 2021-10-27 ENCOUNTER — Ambulatory Visit: Payer: BC Managed Care – PPO

## 2021-12-10 ENCOUNTER — Other Ambulatory Visit: Payer: Self-pay

## 2021-12-10 ENCOUNTER — Ambulatory Visit
Admission: RE | Admit: 2021-12-10 | Discharge: 2021-12-10 | Disposition: A | Discharge status: Home / Self Care | Payer: BC Managed Care – PPO | Source: Ambulatory Visit | Attending: Family Medicine | Admitting: Family Medicine

## 2021-12-10 DIAGNOSIS — Z1231 Encounter for screening mammogram for malignant neoplasm of breast: Secondary | ICD-10-CM | POA: Insufficient documentation

## 2022-01-22 NOTE — Progress Notes (Signed)
Name: Amber Hardin   MRN: [id number]    DOB: 09-16-1966   Date:01/26/2022       Progress Note  Subjective  Chief Complaint  Annual Exam  HPI  Patient presents for annual CPE.  Diet: balanced, she will try to increase fruit intake, but eats lots of vegetables.  Exercise: continue regular physical activity    Flowsheet Row Office Visit from 01/26/2022 in [facility]  AUDIT-C Score 1      Depression: Phq 9 is  negative Depression screen [facility] 2/9 01/26/2022 10/08/2020 07/08/2020 12/18/2019 07/07/2019  Decreased Interest 0 0 0 0 0  Down, Depressed, Hopeless 0 0 0 0 0  PHQ - 2 Score 0 0 0 0 0  Altered sleeping 0 - 0 0 0  Tired, decreased energy 0 - 0 0 0  Change in appetite 0 - 0 0 0  Feeling bad or failure about yourself  0 - 0 0 0  Trouble concentrating 0 - 0 0 0  Moving slowly or fidgety/restless 0 - 0 0 0  Suicidal thoughts 0 - 0 0 0  PHQ-9 Score 0 - 0 0 0  Difficult doing work/chores - - - Not difficult at all -   Hypertension: BP Readings from Last 3 Encounters:  01/26/22 122/74  12/10/20 140/90  10/08/20 116/82   Obesity: Wt Readings from Last 3 Encounters:  01/26/22 151 lb (68.5 kg)  12/10/20 154 lb (69.9 kg)  10/08/20 150 lb 12.8 oz (68.4 kg)   BMI Readings from Last 3 Encounters:  01/26/22 26.75 kg/m  12/10/20 26.43 kg/m  10/08/20 25.88 kg/m     Vaccines:   HPV: N/A Tdap: up to date Shingrix: she had first dose and will go back for booster  Pneumonia: N/A Flu: 09/11/20 COVID-19: up to date   Hep C Screening: 07/05/18 STD testing and prevention (HIV/chl/gon/syphilis): 10/08/20 Intimate partner violence: negative Sexual History : not sexually active  Menstrual History/LMP/Abnormal Bleeding: spotting last Friday, cycles are irregular, perimenopausal  Discussed importance of follow up if any post-menopausal bleeding: not applicable Incontinence Symptoms: she states urgency improved when not drinking caffeine Occasional stress  incontinence   Breast cancer:  - Last Mammogram: 12/10/21 - BRCA gene screening: not interested in genetic testing   Osteoporosis Prevention : Discussed high calcium and vitamin D supplementation, weight bearing exercises Bone density :not applicable  Cervical cancer screening: 07/08/20  Skin cancer: Discussed monitoring for atypical lesions  Colorectal cancer: 08/03/18   Lung cancer:  Low Dose CT Chest recommended if Age 28-80 years, 75 pack-year currently smoking OR have quit w/in 15years. Patient does not qualify.   ECG: N/A  Advanced Care Planning: A voluntary discussion about advance care planning including the explanation and discussion of advance directives.  Discussed health care proxy and Living will, and the patient was able to identify a health care proxy as her cousin Allena Earing .  Patient does not have a living will or power of attorney of health care   Lipids: Lab Results  Component Value Date   CHOL 176 07/07/2019   CHOL 170 07/05/2018   CHOL 192 07/02/2017   Lab Results  Component Value Date   HDL 57 07/07/2019   HDL 53 07/05/2018   HDL 61 07/02/2017   Lab Results  Component Value Date   LDLCALC 101 (H) 07/07/2019   LDLCALC 98 07/05/2018   LDLCALC 112 (H) 07/02/2017   Lab Results  Component Value Date   TRIG 88 07/07/2019  TRIG 91 07/05/2018   TRIG 93 07/02/2017   Lab Results  Component Value Date   CHOLHDL 3.1 07/07/2019   CHOLHDL 3.2 07/05/2018   CHOLHDL 3.1 07/02/2017   No results found for: LDLDIRECT  Glucose: Glucose, Bld  Date Value Ref Range Status  07/08/2020 94 65 - 99 mg/dL Final    Comment:    .            Fasting reference interval .   07/07/2019 99 65 - 99 mg/dL Final    Comment:    .            Fasting reference interval .   07/05/2018 97 65 - 99 mg/dL Final    Comment:    .            Fasting reference interval .     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 No pertinent surgical history.  Family History  Problem Relation Age of Onset   Diabetes Mother    Breast cancer Mother 4   Cancer Mother    Dementia Mother    Alzheimer's disease Mother    Heart disease Father    Heart attack Father 47   COPD Father    Diabetes Father    Diabetes Sister    Thyroid disease Sister    Hypertension Sister    Hyperlipidemia Sister    Hypertension Sister    Breast cancer Maternal Grandmother 48   Cancer Maternal Grandmother    Cancer Maternal Grandfather        lung   Diabetes Paternal Grandmother     Social History   Socioeconomic History   Marital status: Divorced    Spouse name: Not on file   Number of children: 0   Years of education: Not on file   Highest education level: Not on file  Occupational History   Occupation: business Theme park manager     Comment: degree in social work - works in [place] Use   Smoking status: Never   Smokeless tobacco: Never  Scientific laboratory technician Use: Never used  Substance and Sexual Activity   Alcohol use: Not Currently    Alcohol/week: 0.0 standard drinks    Comment: socially   Drug use: No   Sexual activity: Not Currently    Partners: Male    Birth control/protection: Condom, I.U.D.    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 and works in [employer] and independent.    Social Determinants of Health   Financial Resource Strain: Low Risk    Difficulty of Paying Living Expenses: Not hard at all  Food Insecurity: No Food Insecurity   Worried About Charity fundraiser in the  Last Year: Never true   [place] in the Last Year: Never true  Transportation Needs: No Transportation Needs   Lack of Transportation (Medical): No   Lack of Transportation (Non-Medical): No  Physical Activity: Sufficiently Active  Days of Exercise per Week: 3 days   Minutes of Exercise per Session: 60 min  Stress: Stress Concern Present   Feeling of Stress : To some extent  Social Connections: Moderately Integrated   Frequency of Communication with Friends and Family: More than three times a week   Frequency of Social Gatherings with Friends and Family: Once a week   Attends Religious Services: More than 4 times per year   Active Member of [employer] or Organizations: Yes   Attends Music therapist: More than 4 times per year   Marital Status: Divorced  Human resources officer Violence: Not At Risk   Fear of Current or Ex-Partner: No   Emotionally Abused: No   Physically Abused: No   Sexually Abused: No     Current Outpatient Medications:    BIOTIN PO, Take 2 each by mouth daily., Disp: , Rfl:    Cholecalciferol (VITAMIN D3 GUMMIES ADULT PO), Take 2 each by mouth daily., Disp: , Rfl:    fexofenadine (ALLEGRA) 180 MG tablet, Take 1 tablet by mouth daily., Disp: , Rfl:    fluconazole (DIFLUCAN) 150 MG tablet, Take 1 tab Q3 days for 3 doses, then once weekly as preventive for 3-6 months, Disp: 30 tablet, Rfl: 0   Multiple Vitamins-Minerals (MULTIVITAL) CHEW, Chew 1 tablet by mouth daily., Disp: , Rfl:    levonorgestrel (MIRENA) 20 MCG/24HR IUD, 1 Intra Uterine Device (1 each total) by Intrauterine route once for 1 dose., Disp: 1 each, Rfl: 0  Allergies  Allergen Reactions   Latex    Penicillin G Rash     ROS  Constitutional: Negative for fever or weight change.  Respiratory: Negative for cough and shortness of breath.   Cardiovascular: Negative for chest pain or palpitations.  Gastrointestinal: Negative for abdominal pain, no bowel changes.  Musculoskeletal:  Negative for gait problem or joint swelling.  Skin: Negative for rash.  Neurological: Negative for dizziness or headache.  No other specific complaints in a complete review of systems (except as listed in HPI above).   Objective  Vitals:   01/26/22 1025  BP: 122/74  Pulse: 94  Resp: 16  SpO2: 99%  Weight: 151 lb (68.5 kg)  Height: '5\' 3"'  (1.6 m)    Body mass index is 26.75 kg/m.  Physical Exam  Constitutional: Patient appears well-developed and well-nourished. No distress.  HENT: Head: Normocephalic and atraumatic. Ears: B TMs ok, no erythema or effusion; Nose: Not done Mouth/Throat: not done  Eyes: Conjunctivae and EOM are normal. Pupils are equal, round, and reactive to light. No scleral icterus.  Neck: Normal range of motion. Neck supple. No JVD present. No thyromegaly present.  Cardiovascular: Normal rate, regular rhythm and normal heart sounds.  No murmur heard. No BLE edema. Pulmonary/Chest: Effort normal and breath sounds normal. No respiratory distress. Abdominal: Soft. Bowel sounds are normal, no distension. There is no tenderness. no masses Breast: no lumps or masses, no nipple discharge or rashes FEMALE GENITALIA:  Not done  RECTAL: not done  Musculoskeletal: Normal range of motion, no joint effusions. No gross deformities Neurological: he is alert and oriented to person, place, and time. No cranial nerve deficit. Coordination, balance, strength, speech and gait are normal.  Skin: Skin is warm and dry. No rash noted. No erythema.  Psychiatric: Patient has a normal mood and affect. behavior is normal. Judgment and thought content normal.    Fall Risk: Fall Risk  01/26/2022 10/08/2020 07/08/2020 12/18/2019 07/05/2018  Falls in the past year?  0 0 0 0 No  Number falls in past yr: 0 0 0 0 -  Injury with Fall? 0 0 0 0 -  Risk for fall due to : No Fall Risks - - - -  Follow up Falls prevention discussed - - - -     Functional Status Survey: Is the patient deaf or have  difficulty hearing?: No Does the patient have difficulty seeing, even when wearing glasses/contacts?: No Does the patient have difficulty concentrating, remembering, or making decisions?: No Does the patient have difficulty walking or climbing stairs?: No Does the patient have difficulty dressing or bathing?: No Does the patient have difficulty doing errands alone such as visiting a doctor's office or shopping?: No   Assessment & Plan  1. Well adult exam  - Cologuard - Lipid panel - CBC with Differential/Platelet - COMPLETE METABOLIC PANEL WITH GFR - Hemoglobin A1c - VITAMIN D 25 Hydroxy (Vit-D Deficiency, Fractures)  2. Colon cancer screening  - Cologuard  3. High risk human papilloma virus (HPV) infection of cervix   4. Pre-diabetes  - Hemoglobin A1c  5. Lipid screening  - Lipid panel  6. Vitamin D deficiency  - VITAMIN D 25 Hydroxy (Vit-D Deficiency, Fractures)  7. Abnormal CBC   8. Elevated hemoglobin A1c  - CBC with Differential/Platelet  9. Long-term use of high-risk medication  - CBC with Differential/Platelet - COMPLETE METABOLIC PANEL WITH GFR    -USPSTF grade A and B recommendations reviewed with patient; age-appropriate recommendations, preventive care, screening tests, etc discussed and encouraged; healthy living encouraged; see AVS for patient education given to patient -Discussed importance of 150 minutes of physical activity weekly, eat two servings of fish weekly, eat one serving of tree nuts ( cashews, pistachios, pecans, almonds.Marland Kitchen) every other day, eat 6 servings of fruit/vegetables daily and drink plenty of water and avoid sweet beverages.   -Reviewed Health Maintenance: Yes.

## 2022-01-22 NOTE — Patient Instructions (Signed)

## 2022-01-26 ENCOUNTER — Other Ambulatory Visit: Payer: Self-pay

## 2022-01-26 ENCOUNTER — Ambulatory Visit (INDEPENDENT_AMBULATORY_CARE_PROVIDER_SITE_OTHER): Payer: BC Managed Care – PPO | Admitting: Family Medicine

## 2022-01-26 ENCOUNTER — Encounter: Payer: Self-pay | Admitting: Family Medicine

## 2022-01-26 VITALS — BP 122/74 | HR 94 | Resp 16 | Ht 63.0 in | Wt 151.0 lb

## 2022-01-26 DIAGNOSIS — Z1211 Encounter for screening for malignant neoplasm of colon: Secondary | ICD-10-CM

## 2022-01-26 DIAGNOSIS — N72 Inflammatory disease of cervix uteri: Secondary | ICD-10-CM

## 2022-01-26 DIAGNOSIS — R7989 Other specified abnormal findings of blood chemistry: Secondary | ICD-10-CM

## 2022-01-26 DIAGNOSIS — Z13220 Encounter for screening for lipoid disorders: Secondary | ICD-10-CM

## 2022-01-26 DIAGNOSIS — Z79899 Other long term (current) drug therapy: Secondary | ICD-10-CM

## 2022-01-26 DIAGNOSIS — Z0000 Encounter for general adult medical examination without abnormal findings: Secondary | ICD-10-CM

## 2022-01-26 DIAGNOSIS — R7303 Prediabetes: Secondary | ICD-10-CM

## 2022-01-26 DIAGNOSIS — R7309 Other abnormal glucose: Secondary | ICD-10-CM

## 2022-01-26 DIAGNOSIS — E559 Vitamin D deficiency, unspecified: Secondary | ICD-10-CM

## 2022-01-26 DIAGNOSIS — B977 Papillomavirus as the cause of diseases classified elsewhere: Secondary | ICD-10-CM

## 2022-01-27 LAB — COMPLETE METABOLIC PANEL WITH GFR
AG Ratio: 1.5 (calc) (ref 1.0–2.5)
ALT: 8 U/L (ref 6–29)
AST: 14 U/L (ref 10–35)
Albumin: 4.4 g/dL (ref 3.6–5.1)
Alkaline phosphatase (APISO): 63 U/L (ref 37–153)
BUN: 11 mg/dL (ref 7–25)
CO2: 29 mmol/L (ref 20–32)
Calcium: 9.7 mg/dL (ref 8.6–10.4)
Chloride: 101 mmol/L (ref 98–110)
Creat: 0.84 mg/dL (ref 0.50–1.03)
Globulin: 3 g/dL (calc) (ref 1.9–3.7)
Glucose, Bld: 81 mg/dL (ref 65–99)
Potassium: 4.5 mmol/L (ref 3.5–5.3)
Sodium: 138 mmol/L (ref 135–146)
Total Bilirubin: 1.9 mg/dL — ABNORMAL HIGH (ref 0.2–1.2)
Total Protein: 7.4 g/dL (ref 6.1–8.1)
eGFR: 83 mL/min/{1.73_m2} (ref >=60)

## 2022-01-27 LAB — CBC WITH DIFFERENTIAL/PLATELET
Absolute Monocytes: 490 cells/uL (ref 200–950)
Basophils Absolute: 22 cells/uL (ref 0–200)
Basophils Relative: 0.3 %
Eosinophils Absolute: 22 cells/uL (ref 15–500)
Eosinophils Relative: 0.3 %
HCT: 44.7 % (ref 35.0–45.0)
Hemoglobin: 14.6 g/dL (ref 11.7–15.5)
Lymphs Abs: 2700 cells/uL (ref 850–3900)
MCH: 27.1 pg (ref 27.0–33.0)
MCHC: 32.7 g/dL (ref 32.0–36.0)
MCV: 82.9 fL (ref 80.0–100.0)
MPV: 11.8 fL (ref 7.5–12.5)
Monocytes Relative: 6.8 %
Neutro Abs: 3967 cells/uL (ref 1500–7800)
Neutrophils Relative %: 55.1 %
Platelets: 273 10*3/uL (ref 140–400)
RBC: 5.39 10*6/uL — ABNORMAL HIGH (ref 3.80–5.10)
RDW: 12.4 % (ref 11.0–15.0)
Total Lymphocyte: 37.5 %
WBC: 7.2 10*3/uL (ref 3.8–10.8)

## 2022-01-27 LAB — VITAMIN D 25 HYDROXY (VIT D DEFICIENCY, FRACTURES): Vit D, 25-Hydroxy: 41 ng/mL (ref 30–100)

## 2022-01-27 LAB — LIPID PANEL
Cholesterol: 186 mg/dL (ref <200)
HDL: 66 mg/dL (ref >=50)
LDL Cholesterol (Calc): 104 mg/dL (calc) — ABNORMAL HIGH
Non-HDL Cholesterol (Calc): 120 mg/dL (calc) (ref <130)
Total CHOL/HDL Ratio: 2.8 (calc) (ref <5.0)
Triglycerides: 71 mg/dL (ref <150)

## 2022-01-27 LAB — HEMOGLOBIN A1C
Hgb A1c MFr Bld: 5.4 % of total Hgb (ref <5.7)
Mean Plasma Glucose: 108 mg/dL
eAG (mmol/L): 6 mmol/L

## 2022-02-21 LAB — COLOGUARD: COLOGUARD: NEGATIVE

## 2022-03-30 ENCOUNTER — Encounter: Payer: Self-pay | Admitting: Family Medicine

## 2022-05-28 NOTE — Progress Notes (Signed)
Name: Amber Hardin   MRN: [id number]    DOB: 25-Nov-1965   Date:06/01/2022       Progress Note  Subjective  Chief Complaint  Follow Up  HPI  Perimenopause: she has an IUD since 2019 for heavy bleeding. She is spotting every couple of weeks since May and has noticed significant increase of night sweats and also hot flashes during the day. She is more irritable - she is states not sure if also secondary to grieving ( mother died in 02/01/2023) .   Discussed therapeutic options, she does not want to take Effexor at this time, but willing to try Clonidine before bed. 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  Family History  Problem Relation Age of Onset   Diabetes Mother    Breast cancer Mother 80   Cancer Mother    Dementia Mother    Alzheimer's disease Mother    Heart disease Father    Heart attack Father 43   COPD Father    Diabetes Father    Diabetes Sister    Thyroid disease Sister    Hypertension Sister    Hyperlipidemia Sister    Hypertension Sister    Breast cancer Maternal Grandmother 50   Cancer Maternal Grandmother    Cancer Maternal Grandfather        lung   Diabetes Paternal Grandmother     Social History   Tobacco Use   Smoking status: Never   Smokeless tobacco: Never  Substance Use Topics   Alcohol use: Not Currently    Alcohol/week: 0.0 standard drinks of alcohol    Comment: socially     Current Outpatient Medications:    BIOTIN PO, Take 2 each by mouth daily., Disp: , Rfl:    Cholecalciferol (VITAMIN D3 GUMMIES ADULT PO), Take 2 each by mouth daily., Disp: , Rfl:    fexofenadine (ALLEGRA) 180 MG tablet, Take 1 tablet by mouth daily.,  Disp: , Rfl:    fluconazole (DIFLUCAN) 150 MG tablet, Take 1 tab Q3 days for 3 doses, then once weekly as preventive for 3-6 months, Disp: 30 tablet, Rfl: 0   Multiple Vitamins-Minerals (MULTIVITAL) CHEW, Chew 1 tablet by mouth daily., Disp: , Rfl:    levonorgestrel (MIRENA) 20 MCG/24HR IUD, 1 Intra Uterine Device (1 each total) by Intrauterine route once for 1 dose., Disp: 1 each, Rfl: 0  Allergies  Allergen Reactions   Latex    Penicillin G Rash    I personally reviewed active problem list, medication list, allergies, family history, social history, health maintenance with the patient/caregiver today.   ROS  Ten systems reviewed and is negative except as mentioned in HPI   Objective  Vitals:   06/01/22 0853  BP: 124/76  Pulse: 91  Resp: 16  SpO2: 99%  Weight: 156 lb (70.8 kg)  Height: 5\' 4"  (1.626 m)    Body mass index is 26.78 kg/m.  Physical Exam  Constitutional: Patient appears well-developed and well-nourished.  No distress.  HEENT: head atraumatic, normocephalic, pupils equal and reactive to light, neck supple Cardiovascular: Normal rate, regular rhythm and normal heart sounds.  No murmur heard. No BLE edema. Pulmonary/Chest: Effort normal and breath  sounds normal. No respiratory distress. Abdominal: Soft.  There is no tenderness. Psychiatric: Patient has a normal mood and affect. behavior is normal. Judgment and thought content normal.    PHQ2/9:    06/01/2022    8:53 AM 01/26/2022   10:24 AM 10/08/2020    1:55 PM 07/08/2020    8:20 AM 12/18/2019    1:43 PM  Depression screen PHQ 2/9  Decreased Interest 0 0 0 0 0  Down, Depressed, Hopeless 0 0 0 0 0  PHQ - 2 Score 0 0 0 0 0  Altered sleeping  0  0 0  Tired, decreased energy  0  0 0  Change in appetite  0  0 0  Feeling bad or failure about yourself   0  0 0  Trouble concentrating  0  0 0  Moving slowly or fidgety/restless  0  0 0  Suicidal thoughts  0  0 0  PHQ-9 Score  0  0 0  Difficult doing  work/chores     Not difficult at all    phq 9 is negative   Fall Risk:    06/01/2022    8:53 AM 01/26/2022   10:24 AM 10/08/2020    1:55 PM 07/08/2020    8:20 AM 12/18/2019    1:42 PM  Fall Risk   Falls in the past year? 0 0 0 0 0  Number falls in past yr: 0 0 0 0 0  Injury with Fall? 0 0 0 0 0  Risk for fall due to : No Fall Risks No Fall Risks     Follow up Falls prevention discussed Falls prevention discussed         Functional Status Survey: Is the patient deaf or have difficulty hearing?: No Does the patient have difficulty seeing, even when wearing glasses/contacts?: No Does the patient have difficulty concentrating, remembering, or making decisions?: No Does the patient have difficulty walking or climbing stairs?: No Does the patient have difficulty dressing or bathing?: No Does the patient have difficulty doing errands alone such as visiting a doctor's office or shopping?: No    Assessment & Plan  1. Hot flashes  - TSH - cloNIDine (CATAPRES) 0.1 MG tablet; Take 1 tablet (0.1 mg total) by mouth at bedtime.  Dispense: 90 tablet; Refill: 0

## 2022-06-01 ENCOUNTER — Encounter: Payer: Self-pay | Admitting: Family Medicine

## 2022-06-01 ENCOUNTER — Ambulatory Visit: Payer: BC Managed Care – PPO | Admitting: Family Medicine

## 2022-06-01 VITALS — BP 124/76 | HR 91 | Resp 16 | Ht 64.0 in | Wt 156.0 lb

## 2022-06-01 DIAGNOSIS — R232 Flushing: Secondary | ICD-10-CM

## 2022-06-01 MED ORDER — CLONIDINE HCL 0.1 MG PO TABS: 0.1000 mg | ORAL_TABLET | Freq: Every evening | ORAL | 0 refills | Status: DC

## 2022-06-02 LAB — TSH: TSH: 0.79 mIU/L

## 2022-11-24 ENCOUNTER — Other Ambulatory Visit: Payer: Self-pay | Admitting: Family Medicine

## 2022-11-24 DIAGNOSIS — Z1231 Encounter for screening mammogram for malignant neoplasm of breast: Secondary | ICD-10-CM

## 2022-12-14 ENCOUNTER — Ambulatory Visit
Admission: RE | Admit: 2022-12-14 | Discharge: 2022-12-14 | Disposition: A | Discharge status: Home / Self Care | Payer: BC Managed Care – PPO | Source: Ambulatory Visit | Attending: Family Medicine | Admitting: Family Medicine

## 2022-12-14 DIAGNOSIS — Z1231 Encounter for screening mammogram for malignant neoplasm of breast: Secondary | ICD-10-CM | POA: Insufficient documentation

## 2023-07-16 ENCOUNTER — Encounter: Payer: Self-pay | Admitting: Family Medicine

## 2023-07-16 ENCOUNTER — Other Ambulatory Visit (HOSPITAL_COMMUNITY)
Admission: RE | Admit: 2023-07-16 | Discharge: 2023-07-16 | Disposition: A | Discharge status: Home / Self Care | Payer: BC Managed Care – PPO | Source: Ambulatory Visit | Attending: Family Medicine | Admitting: Family Medicine

## 2023-07-16 ENCOUNTER — Ambulatory Visit (INDEPENDENT_AMBULATORY_CARE_PROVIDER_SITE_OTHER): Payer: BC Managed Care – PPO | Admitting: Family Medicine

## 2023-07-16 VITALS — BP 118/70 | HR 76 | Temp 98.0°F | Resp 12 | Ht 64.0 in | Wt 159.2 lb

## 2023-07-16 DIAGNOSIS — Z113 Encounter for screening for infections with a predominantly sexual mode of transmission: Secondary | ICD-10-CM

## 2023-07-16 DIAGNOSIS — Z0000 Encounter for general adult medical examination without abnormal findings: Secondary | ICD-10-CM | POA: Diagnosis present

## 2023-07-16 DIAGNOSIS — Z13220 Encounter for screening for lipoid disorders: Secondary | ICD-10-CM

## 2023-07-16 DIAGNOSIS — Z1151 Encounter for screening for human papillomavirus (HPV): Secondary | ICD-10-CM | POA: Insufficient documentation

## 2023-07-16 DIAGNOSIS — Z01419 Encounter for gynecological examination (general) (routine) without abnormal findings: Secondary | ICD-10-CM | POA: Insufficient documentation

## 2023-07-16 DIAGNOSIS — B977 Papillomavirus as the cause of diseases classified elsewhere: Secondary | ICD-10-CM | POA: Insufficient documentation

## 2023-07-16 DIAGNOSIS — N72 Inflammatory disease of cervix uteri: Secondary | ICD-10-CM | POA: Diagnosis present

## 2023-07-16 DIAGNOSIS — R7303 Prediabetes: Secondary | ICD-10-CM | POA: Diagnosis not present

## 2023-07-16 DIAGNOSIS — N926 Irregular menstruation, unspecified: Secondary | ICD-10-CM

## 2023-07-16 NOTE — Progress Notes (Signed)
Name: Amber Hardin   MRN: [id number]    DOB: 1966/08/16   Date:07/16/2023       Progress Note  Subjective  Chief Complaint  CPE  HPI  Patient presents for annual CPE.  Diet: balanced  Exercise: continue regular activity  Last Eye Exam: up to date  Last Dental Exam: up to date   Flowsheet Row Office Visit from 07/16/2023 in [facility]  AUDIT-C Score 1       Depression: Phq 9 is  negative    07/16/2023    2:16 PM 06/01/2022    8:53 AM 01/26/2022   10:24 AM 10/08/2020    1:55 PM 07/08/2020    8:20 AM  Depression screen PHQ 2/9  Decreased Interest 0 0 0 0 0  Down, Depressed, Hopeless 0 0 0 0 0  PHQ - 2 Score 0 0 0 0 0  Altered sleeping 0  0  0  Tired, decreased energy 0  0  0  Change in appetite 0  0  0  Feeling bad or failure about yourself  0  0  0  Trouble concentrating 0  0  0  Moving slowly or fidgety/restless 0  0  0  Suicidal thoughts 0  0  0  PHQ-9 Score 0  0  0   Hypertension: BP Readings from Last 3 Encounters:  07/16/23 118/70  06/01/22 124/76  01/26/22 122/74   Obesity: Wt Readings from Last 3 Encounters:  07/16/23 159 lb 3.2 oz (72.2 kg)  06/01/22 156 lb (70.8 kg)  01/26/22 151 lb (68.5 kg)   BMI Readings from Last 3 Encounters:  07/16/23 27.33 kg/m  06/01/22 26.78 kg/m  01/26/22 26.75 kg/m     Vaccines:   Tdap: up to date  Shingrix: up to date  Pneumonia: N/A Flu: she will get it in October  COVID-19:up to dat e   Hep C Screening: up to date  STD testing and prevention (HIV/chl/gon/syphilis): today  Intimate partner violence: negative screen  Sexual History : one partner, uses condoms  Menstrual History/LMP/Abnormal Bleeding: she has an IUD - Mirena Feb 2019, she had spotting initially and over the past two years sporadic cycles, however started a cycle on 06/30/2023, some breast tenderness and some cramping  Discussed importance of follow up if any post-menopausal bleeding: not applicable   Incontinence Symptoms: negative for symptoms   Breast cancer:  - Last Mammogram: up to date  - BRCA gene screening: maternal grandmother and mother but she does not want to be tested   Osteoporosis Prevention : Discussed high calcium die  and vitamin D supplementation, weight bearing exercises Bone density :not applicable   Cervical cancer screening:   Skin cancer: Discussed monitoring for atypical lesions  Colorectal cancer: cologuard negative    Lung cancer:  never smoked  ECG: N/A  Advanced Care Planning: A voluntary discussion about advance care planning including the explanation and discussion of advance directives.  Discussed health care proxy and Living will, and the patient was able to identify a health care proxy as Loralee Pacas .  Patient does not have a living will and power of attorney of health care   Lipids: Lab Results  Component Value Date   CHOL 186 01/26/2022   CHOL 176 07/07/2019   CHOL 170 07/05/2018   Lab Results  Component Value Date   HDL 66 01/26/2022   HDL 57 07/07/2019   HDL 53 07/05/2018   Lab Results  Component Value  Date   LDLCALC 104 (H) 01/26/2022   LDLCALC 101 (H) 07/07/2019   LDLCALC 98 07/05/2018   Lab Results  Component Value Date   TRIG 71 01/26/2022   TRIG 88 07/07/2019   TRIG 91 07/05/2018   Lab Results  Component Value Date   CHOLHDL 2.8 01/26/2022   CHOLHDL 3.1 07/07/2019   CHOLHDL 3.2 07/05/2018   No results found for: "LDLDIRECT"  Glucose: Glucose, Bld  Date Value Ref Range Status  01/26/2022 81 65 - 99 mg/dL Final    Comment:    .            Fasting reference interval .   07/08/2020 94 65 - 99 mg/dL Final    Comment:    .            Fasting reference interval .   07/07/2019 99 65 - 99 mg/dL Final    Comment:    .            Fasting reference interval .     Patient Active Problem List   Diagnosis Date Noted   Elevated hemoglobin A1c 08/07/2019   Menometrorrhagia 12/29/2017   Allergic  rhinitis 06/22/2015   History of HPV infection 06/22/2015   Nonspecific reaction to tuberculin skin test 06/22/2015   Vitamin D deficiency 06/22/2015    History reviewed. No pertinent surgical history.  Family History  Problem Relation Age of Onset   Diabetes Mother    Breast cancer Mother 74   Cancer Mother    Dementia Mother    Alzheimer's disease Mother    Heart disease Father    Heart attack Father 2   COPD Father    Diabetes Father    Diabetes Sister    Thyroid disease Sister    Hypertension Sister    Hyperlipidemia Sister    Hypertension Sister    Breast cancer Maternal Grandmother 50   Cancer Maternal Grandmother    Cancer Maternal Grandfather        lung   Diabetes Paternal Grandmother     Social History   Socioeconomic History   Marital status: Divorced    Spouse name: Not on file   Number of children: 0   Years of education: Not on file   Highest education level: Master's degree (e.g., MA, MS, MEng, MEd, MSW, MBA)  Occupational History   Occupation: business Company secretary     Comment: degree in social work - works in [employer]   Tobacco Use   Smoking status: Never   Smokeless tobacco: Never  Vaping Use   Vaping status: Never Used  Substance and Sexual Activity   Alcohol use: Not Currently    Alcohol/week: 0.0 standard drinks of alcohol    Comment: socially   Drug use: No   Sexual activity: Not Currently    Partners: Male    Birth control/protection: Condom, I.U.D.    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    Social Determinants of Health   Financial Resource Strain: Low Risk  (07/12/2023)   Overall Financial Resource Strain (CARDIA)    Difficulty of Paying Living Expenses: Not very hard  Food  Insecurity: No Food Insecurity (07/12/2023)   Hunger  Vital Sign    Worried About Programme researcher, broadcasting/film/video in the Last Year: Never true    Ran Out of Food in the Last Year: Never true  Transportation Needs: No Transportation Needs (07/12/2023)   PRAPARE - Administrator, Civil Service (Medical): No    Lack of Transportation (Non-Medical): No  Physical Activity: Sufficiently Active (07/12/2023)   Exercise Vital Sign    Days of Exercise per Week: 4 days    Minutes of Exercise per Session: 60 min  Stress: No Stress Concern Present (07/12/2023)   [employer] of Occupational Health - Occupational Stress Questionnaire    Feeling of Stress : Only a little  Social Connections: Moderately Integrated (07/12/2023)   Social Connection and Isolation Panel [NHANES]    Frequency of Communication with Friends and Family: More than three times a week    Frequency of Social Gatherings with Friends and Family: More than three times a week    Attends Religious Services: More than 4 times per year    Active Member of [employer] or Organizations: Yes    Attends Engineer, structural: More than 4 times per year    Marital Status: Divorced  Intimate Partner Violence: Not At Risk (01/26/2022)   Humiliation, Afraid, Rape, and Kick questionnaire    Fear of Current or Ex-Partner: No    Emotionally Abused: No    Physically Abused: No    Sexually Abused: No     Current Outpatient Medications:    Bacillus Coagulans-Inulin (ALIGN PREBIOTIC-PROBIOTIC) 5-1.25 MG-GM CHEW, , Disp: , Rfl:    BIOTIN PO, Take 2 each by mouth daily., Disp: , Rfl:    Cholecalciferol (VITAMIN D3 GUMMIES ADULT PO), Take 2 each by mouth daily., Disp: , Rfl:    fexofenadine (ALLEGRA) 180 MG tablet, Take 1 tablet by mouth daily., Disp: , Rfl:    Multiple Vitamins-Minerals (MULTIVITAL) CHEW, Chew 1 tablet by mouth daily., Disp: , Rfl:    cloNIDine (CATAPRES) 0.1 MG tablet, Take 1 tablet (0.1 mg total) by mouth at bedtime. (Patient  not taking: Reported on 07/16/2023), Disp: 90 tablet, Rfl: 0   fluconazole (DIFLUCAN) 150 MG tablet, Take 1 tab Q3 days for 3 doses, then once weekly as preventive for 3-6 months (Patient not taking: Reported on 07/16/2023), Disp: 30 tablet, Rfl: 0   levonorgestrel (MIRENA) 20 MCG/24HR IUD, 1 Intra Uterine Device (1 each total) by Intrauterine route once for 1 dose., Disp: 1 each, Rfl: 0  Allergies  Allergen Reactions   Latex    Penicillin G Rash     ROS  Constitutional: Negative for fever or weight change.  Respiratory: Negative for cough and shortness of breath.   Cardiovascular: Negative for chest pain or palpitations.  Gastrointestinal: Negative for abdominal pain, no bowel changes.  Musculoskeletal: Negative for gait problem or joint swelling.  Skin: Negative for rash.  Neurological: Negative for dizziness or headache.  No other specific complaints in a complete review of systems (except as listed in HPI above).   Objective  Vitals:   07/16/23 1417  BP: 118/70  Pulse: 76  Resp: 12  Temp: 98 F (36.7 C)  TempSrc: Oral  SpO2: 100%  Weight: 159 lb 3.2 oz (72.2 kg)  Height: 5\' 4"  (1.626 m)    Body mass index is 27.33 kg/m.  Physical Exam  Constitutional: Patient appears well-developed and well-nourished. No distress.  HENT: Head: Normocephalic and atraumatic. Ears: B TMs ok, no erythema or effusion; Nose: Nose normal. Mouth/Throat:  Oropharynx is clear and moist. No oropharyngeal exudate.  Eyes: Conjunctivae and EOM are normal. Pupils are equal, round, and reactive to light. No scleral icterus.  Neck: Normal range of motion. Neck supple. No JVD present. No thyromegaly present.  Cardiovascular: Normal rate, regular rhythm and normal heart sounds.  No murmur heard. No BLE edema. Pulmonary/Chest: Effort normal and breath sounds normal. No respiratory distress. Abdominal: Soft. Bowel sounds are normal, no distension. There is no tenderness. no masses Breast: no lumps or  masses, no nipple discharge or rashes FEMALE GENITALIA:  External genitalia normal External urethra normal Vaginal vault normal without discharge or lesions Cervix normal without discharge or lesions Bimanual exam normal without masses RECTAL: not done  Musculoskeletal: Normal range of motion, no joint effusions. No gross deformities Neurological: he is alert and oriented to person, place, and time. No cranial nerve deficit. Coordination, balance, strength, speech and gait are normal.  Skin: Skin is warm and dry. No rash noted. No erythema.  Psychiatric: Patient has a normal mood and affect. behavior is normal. Judgment and thought content normal.   Fall Risk:    07/16/2023    2:16 PM 06/01/2022    8:53 AM 01/26/2022   10:24 AM 10/08/2020    1:55 PM 07/08/2020    8:20 AM  Fall Risk   Falls in the past year? 0 0 0 0 0  Number falls in past yr:  0 0 0 0  Injury with Fall?  0 0 0 0  Risk for fall due to : No Fall Risks No Fall Risks No Fall Risks    Follow up Falls prevention discussed Falls prevention discussed Falls prevention discussed       Functional Status Survey: Is the patient deaf or have difficulty hearing?: No Does the patient have difficulty seeing, even when wearing glasses/contacts?: No Does the patient have difficulty concentrating, remembering, or making decisions?: No Does the patient have difficulty walking or climbing stairs?: No Does the patient have difficulty dressing or bathing?: No Does the patient have difficulty doing errands alone such as visiting a doctor's office or shopping?: No   Assessment & Plan  1. Well adult exam  - Cytology - PAP - Lipid panel - CBC with Differential/Platelet - COMPLETE METABOLIC PANEL WITH GFR - Hemoglobin A1c  2. High risk human papilloma virus (HPV) infection of cervix  - Cytology - PAP  3. Pre-diabetes  - Hemoglobin A1c  4. Lipid screening  - Lipid panel  5. Screening examination for STI  - HIV Antibody  (routine testing w rflx) - RPR  6. Irregular menstrual cycle  - FSH/LH  - TSH  -USPSTF grade A and B recommendations reviewed with patient; age-appropriate recommendations, preventive care, screening tests, etc discussed and encouraged; healthy living encouraged; see AVS for patient education given to patient -Discussed importance of 150 minutes of physical activity weekly, eat two servings of fish weekly, eat one serving of tree nuts ( cashews, pistachios, pecans, almonds.Marland Kitchen) every other day, eat 6 servings of fruit/vegetables daily and drink plenty of water and avoid sweet beverages.   -Reviewed Health Maintenance: Yes.

## 2023-07-17 LAB — COMPLETE METABOLIC PANEL WITH GFR
AG Ratio: 1.5 (calc) (ref 1.0–2.5)
ALT: 15 U/L (ref 6–29)
AST: 15 U/L (ref 10–35)
Albumin: 4.4 g/dL (ref 3.6–5.1)
Alkaline phosphatase (APISO): 72 U/L (ref 37–153)
BUN: 8 mg/dL (ref 7–25)
CO2: 23 mmol/L (ref 20–32)
Calcium: 9.6 mg/dL (ref 8.6–10.4)
Chloride: 100 mmol/L (ref 98–110)
Creat: 0.92 mg/dL (ref 0.50–1.03)
Globulin: 2.9 g/dL (ref 1.9–3.7)
Glucose, Bld: 90 mg/dL (ref 65–99)
Potassium: 4.2 mmol/L (ref 3.5–5.3)
Sodium: 136 mmol/L (ref 135–146)
Total Bilirubin: 1.6 mg/dL — ABNORMAL HIGH (ref 0.2–1.2)
Total Protein: 7.3 g/dL (ref 6.1–8.1)
eGFR: 74 mL/min/{1.73_m2} (ref >=60)

## 2023-07-17 LAB — LIPID PANEL
Cholesterol: 209 mg/dL — ABNORMAL HIGH (ref <200)
HDL: 70 mg/dL (ref >=50)
LDL Cholesterol (Calc): 119 mg/dL — ABNORMAL HIGH
Non-HDL Cholesterol (Calc): 139 mg/dL (calc) — ABNORMAL HIGH (ref <130)
Total CHOL/HDL Ratio: 3 (calc) (ref <5.0)
Triglycerides: 95 mg/dL (ref <150)

## 2023-07-17 LAB — CBC WITH DIFFERENTIAL/PLATELET
Absolute Monocytes: 552 {cells}/uL (ref 200–950)
Basophils Absolute: 41 {cells}/uL (ref 0–200)
Basophils Relative: 0.6 %
Eosinophils Absolute: 62 {cells}/uL (ref 15–500)
Eosinophils Relative: 0.9 %
HCT: 44.2 % (ref 35.0–45.0)
Hemoglobin: 14.5 g/dL (ref 11.7–15.5)
Lymphs Abs: 3071 {cells}/uL (ref 850–3900)
MCH: 27 pg (ref 27.0–33.0)
MCHC: 32.8 g/dL (ref 32.0–36.0)
MCV: 82.2 fL (ref 80.0–100.0)
MPV: 12.2 fL (ref 7.5–12.5)
Monocytes Relative: 8 %
Neutro Abs: 3174 {cells}/uL (ref 1500–7800)
Neutrophils Relative %: 46 %
Platelets: 275 10*3/uL (ref 140–400)
RBC: 5.38 10*6/uL — ABNORMAL HIGH (ref 3.80–5.10)
RDW: 12.9 % (ref 11.0–15.0)
Total Lymphocyte: 44.5 %
WBC: 6.9 10*3/uL (ref 3.8–10.8)

## 2023-07-17 LAB — HEMOGLOBIN A1C
Hgb A1c MFr Bld: 5.7 %{Hb} — ABNORMAL HIGH (ref <5.7)
Mean Plasma Glucose: 117 mg/dL
eAG (mmol/L): 6.5 mmol/L

## 2023-07-17 LAB — TSH: TSH: 1.63 m[IU]/L

## 2023-07-17 LAB — HIV ANTIBODY (ROUTINE TESTING W REFLEX): HIV 1&2 Ab, 4th Generation: NONREACTIVE

## 2023-07-17 LAB — FSH/LH
FSH: 5.4 m[IU]/mL
LH: 1.4 m[IU]/mL

## 2023-07-17 LAB — RPR: RPR Ser Ql: NONREACTIVE

## 2023-07-22 LAB — CYTOLOGY - PAP
Adequacy: ABSENT
Chlamydia: NEGATIVE
Comment: NEGATIVE
Comment: NEGATIVE
Comment: NORMAL
Diagnosis: NEGATIVE
High risk HPV: NEGATIVE
Neisseria Gonorrhea: NEGATIVE

## 2023-09-20 ENCOUNTER — Encounter: Payer: Self-pay | Admitting: Family Medicine

## 2023-11-22 ENCOUNTER — Other Ambulatory Visit: Payer: Self-pay | Admitting: Family Medicine

## 2023-11-22 DIAGNOSIS — Z1231 Encounter for screening mammogram for malignant neoplasm of breast: Secondary | ICD-10-CM

## 2023-12-16 ENCOUNTER — Ambulatory Visit
Admission: RE | Admit: 2023-12-16 | Discharge: 2023-12-16 | Disposition: A | Discharge status: Home / Self Care | Payer: 59 | Source: Ambulatory Visit | Attending: Family Medicine | Admitting: Family Medicine

## 2023-12-16 DIAGNOSIS — Z1231 Encounter for screening mammogram for malignant neoplasm of breast: Secondary | ICD-10-CM | POA: Diagnosis present

## 2024-07-28 NOTE — Patient Instructions (Signed)
 Preventive Care 56-56 Years Old, Female  Preventive care refers to lifestyle choices and visits with your health care provider that can promote health and wellness. Preventive care visits are also called wellness exams.  What can I expect for my preventive care visit?  Counseling  Your health care provider may ask you questions about your:  Medical history, including:  Past medical problems.  Family medical history.  Pregnancy history.  Current health, including:  Menstrual cycle.  Method of birth control.  Emotional well-being.  Home life and relationship well-being.  Sexual activity and sexual health.  Lifestyle, including:  Alcohol, nicotine or tobacco, and drug use.  Access to firearms.  Diet, exercise, and sleep habits.  Work and work Astronomer.  Sunscreen use.  Safety issues such as seatbelt and bike helmet use.  Physical exam  Your health care provider will check your:  Height and weight. These may be used to calculate your BMI (body mass index). BMI is a measurement that tells if you are at a healthy weight.  Waist circumference. This measures the distance around your waistline. This measurement also tells if you are at a healthy weight and may help predict your risk of certain diseases, such as type 2 diabetes and high blood pressure.  Heart rate and blood pressure.  Body temperature.  Skin for abnormal spots.  What immunizations do I need?    Vaccines are usually given at various ages, according to a schedule. Your health care provider will recommend vaccines for you based on your age, medical history, and lifestyle or other factors, such as travel or where you work.  What tests do I need?  Screening  Your health care provider may recommend screening tests for certain conditions. This may include:  Lipid and cholesterol levels.  Diabetes screening. This is done by checking your blood sugar (glucose) after you have not eaten for a while (fasting).  Pelvic exam and Pap test.  Hepatitis B test.  Hepatitis C  test.  HIV (human immunodeficiency virus) test.  STI (sexually transmitted infection) testing, if you are at risk.  Lung cancer screening.  Colorectal cancer screening.  Mammogram. Talk with your health care provider about when you should start having regular mammograms. This may depend on whether you have a family history of breast cancer.  BRCA-related cancer screening. This may be done if you have a family history of breast, ovarian, tubal, or peritoneal cancers.  Bone density scan. This is done to screen for osteoporosis.  Talk with your health care provider about your test results, treatment options, and if necessary, the need for more tests.  Follow these instructions at home:  Eating and drinking    Eat a diet that includes fresh fruits and vegetables, whole grains, lean protein, and low-fat dairy products.  Take vitamin and mineral supplements as recommended by your health care provider.  Do not drink alcohol if:  Your health care provider tells you not to drink.  You are pregnant, may be pregnant, or are planning to become pregnant.  If you drink alcohol:  Limit how much you have to 0-1 drink a day.  Know how much alcohol is in your drink. In the [place] one drink equals one 12 oz bottle of beer (355 mL), one 5 oz glass of wine (148 mL), or one 1 oz glass of hard liquor (44 mL).  Lifestyle  Brush your teeth every morning and night with fluoride toothpaste. Floss one time each day.  Exercise for at least  30 minutes 5 or more days each week.  Do not use any products that contain nicotine or tobacco. These products include cigarettes, chewing tobacco, and vaping devices, such as e-cigarettes. If you need help quitting, ask your health care provider.  Do not use drugs.  If you are sexually active, practice safe sex. Use a condom or other form of protection to prevent STIs.  If you do not wish to become pregnant, use a form of birth control. If you plan to become pregnant, see your health care provider for a  prepregnancy visit.  Take aspirin only as told by your health care provider. Make sure that you understand how much to take and what form to take. Work with your health care provider to find out whether it is safe and beneficial for you to take aspirin daily.  Find healthy ways to manage stress, such as:  Meditation, yoga, or listening to music.  Journaling.  Talking to a trusted person.  Spending time with friends and family.  Minimize exposure to UV radiation to reduce your risk of skin cancer.  Safety  Always wear your seat belt while driving or riding in a vehicle.  Do not drive:  If you have been drinking alcohol. Do not ride with someone who has been drinking.  When you are tired or distracted.  While texting.  If you have been using any mind-altering substances or drugs.  Wear a helmet and other protective equipment during sports activities.  If you have firearms in your house, make sure you follow all gun safety procedures.  Seek help if you have been physically or sexually abused.  What's next?  Visit your health care provider once a year for an annual wellness visit.  Ask your health care provider how often you should have your eyes and teeth checked.  Stay up to date on all vaccines.  This information is not intended to replace advice given to you by your health care provider. Make sure you discuss any questions you have with your health care provider.  Document Revised: 04/30/2021 Document Reviewed: 04/30/2021  Elsevier Patient Education  2024 [employer].

## 2024-07-31 ENCOUNTER — Encounter: Payer: Self-pay | Admitting: Family Medicine

## 2024-07-31 ENCOUNTER — Other Ambulatory Visit (HOSPITAL_COMMUNITY)
Admission: RE | Admit: 2024-07-31 | Discharge: 2024-07-31 | Disposition: A | Discharge status: Home / Self Care | Source: Ambulatory Visit | Attending: Family Medicine | Admitting: Family Medicine

## 2024-07-31 ENCOUNTER — Ambulatory Visit (INDEPENDENT_AMBULATORY_CARE_PROVIDER_SITE_OTHER): Admitting: Family Medicine

## 2024-07-31 VITALS — BP 118/76 | HR 79 | Resp 16 | Ht 64.0 in | Wt 165.9 lb

## 2024-07-31 DIAGNOSIS — Z113 Encounter for screening for infections with a predominantly sexual mode of transmission: Secondary | ICD-10-CM | POA: Insufficient documentation

## 2024-07-31 DIAGNOSIS — Z0000 Encounter for general adult medical examination without abnormal findings: Secondary | ICD-10-CM

## 2024-07-31 DIAGNOSIS — Z13220 Encounter for screening for lipoid disorders: Secondary | ICD-10-CM | POA: Diagnosis not present

## 2024-07-31 DIAGNOSIS — N951 Menopausal and female climacteric states: Secondary | ICD-10-CM

## 2024-07-31 DIAGNOSIS — Z1231 Encounter for screening mammogram for malignant neoplasm of breast: Secondary | ICD-10-CM

## 2024-07-31 DIAGNOSIS — Z1159 Encounter for screening for other viral diseases: Secondary | ICD-10-CM | POA: Diagnosis not present

## 2024-07-31 DIAGNOSIS — R7303 Prediabetes: Secondary | ICD-10-CM

## 2024-07-31 DIAGNOSIS — N921 Excessive and frequent menstruation with irregular cycle: Secondary | ICD-10-CM

## 2024-07-31 DIAGNOSIS — Z1211 Encounter for screening for malignant neoplasm of colon: Secondary | ICD-10-CM

## 2024-07-31 NOTE — Progress Notes (Signed)
 Name: Amber Hardin   MRN: [id number]    DOB: 1966/02/02   Date:07/31/2024       Progress Note  Subjective  Chief Complaint  Chief Complaint  Patient presents with   Annual Exam    HPI  Patient presents for annual CPE.  Diet: balanced diet  Exercise: continue regular activity   Last Eye Exam: completed Last Dental Exam: completed  Flowsheet Row Office Visit from 07/31/2024 in [facility]  AUDIT-C Score 1    Depression: Phq 9 is  negative    07/31/2024   10:32 AM 07/16/2023    2:16 PM 06/01/2022    8:53 AM 01/26/2022   10:24 AM 10/08/2020    1:55 PM  Depression screen PHQ 2/9  Decreased Interest 0 0 0 0 0  Down, Depressed, Hopeless 0 0 0 0 0  PHQ - 2 Score 0 0 0 0 0  Altered sleeping  0  0   Tired, decreased energy  0  0   Change in appetite  0  0   Feeling bad or failure about yourself   0  0   Trouble concentrating  0  0   Moving slowly or fidgety/restless  0  0   Suicidal thoughts  0  0   PHQ-9 Score  0  0    Hypertension: BP Readings from Last 3 Encounters:  07/31/24 118/76  07/16/23 118/70  06/01/22 124/76   Obesity: Wt Readings from Last 3 Encounters:  07/31/24 165 lb 14.4 oz (75.3 kg)  07/16/23 159 lb 3.2 oz (72.2 kg)  06/01/22 156 lb (70.8 kg)   BMI Readings from Last 3 Encounters:  07/31/24 28.48 kg/m  07/16/23 27.33 kg/m  06/01/22 26.78 kg/m     Vaccines: reviewed with the patient.   Hep C Screening: completed STD testing and prevention (HIV/chl/gon/syphilis): last partner April and we will recheck today  Intimate partner violence: negative screen  Sexual History : not currently sexually active  Menstrual History/LMP/Abnormal Bleeding:irregular cycles , perimenopausal, currently bleeding for two weeks and will contact gyn  Discussed importance of follow up if any post-menopausal bleeding: not applicable  Incontinence Symptoms: negative for symptoms   Breast cancer:  - Last Mammogram: up to date - BRCA  gene screening: N/A  Osteoporosis Prevention : Discussed high calcium and vitamin D  supplementation, weight bearing exercises Bone density :yes   Cervical cancer screening: up-to-date  Skin cancer: Discussed monitoring for atypical lesions  Colorectal cancer: ordering cologuard    Lung cancer:  Low Dose CT Chest recommended if Age 73-80 years, 20 pack-year currently smoking OR have quit w/in 15years. Patient does not qualify for screen   ECG: we will recheck next year, she will contact insurance first to see if covered   Advanced Care Planning: A voluntary discussion about advance care planning including the explanation and discussion of advance directives.  Discussed health care proxy and Living will, and the patient was able to identify a health care proxy as [place] B[employer] .  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 No pertinent surgical history.  Family History  Problem Relation Age of Onset  Diabetes Mother    Breast cancer Mother 45   Cancer Mother    Dementia Mother    Alzheimer's disease Mother    Heart disease Father    Heart attack Father 103   COPD Father    Diabetes Father    Diabetes Sister    Thyroid disease Sister    Hypertension Sister    Hyperlipidemia Sister    Hypertension Sister    Cancer Sister    Breast cancer Maternal Grandmother 50   Cancer Maternal Grandmother    Cancer Maternal Grandfather        lung   Vision loss Maternal Grandfather    Diabetes Paternal Grandmother     Social History   Socioeconomic History   Marital status: Divorced    Spouse name: Not on file   Number of children: 0   Years of  education: Not on file   Highest education level: Master's degree (e.g., MA, MS, MEng, MEd, MSW, MBA)  Occupational History   Occupation: business Company secretary     Comment: degree in social work - works in [employer]   Tobacco Use   Smoking status: Never   Smokeless tobacco: Never  Vaping Use   Vaping status: Never Used  Substance and Sexual Activity   Alcohol use: Not Currently    Comment: socially   Drug use: No   Sexual activity: Not Currently    Partners: Male    Birth control/protection: Condom, I.U.D.    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    Social Drivers of Corporate investment banker Strain: Low Risk  (07/24/2024)   Overall Financial Resource Strain (CARDIA)    Difficulty of Paying Living Expenses: Not hard at all  Food Insecurity: No Food Insecurity (07/24/2024)   Hunger Vital Sign    Worried About Running Out of Food in the Last Year: Never true    Ran Out of Food in the Last Year: Never true  Transportation Needs: No Transportation Needs (07/24/2024)   PRAPARE - Administrator, Civil Service (Medical): No    Lack of Transportation (Non-Medical): No  Physical Activity: Sufficiently Active (07/24/2024)   Exercise Vital Sign    Days of Exercise per Week: 4 days    Minutes of Exercise per Session: 60 min  Stress: No Stress Concern Present (07/24/2024)   [employer] of Occupational Health - Occupational Stress Questionnaire    Feeling of Stress: Not at all  Social Connections: Moderately Integrated (07/24/2024)   Social Connection and Isolation Panel    Frequency of Communication with Friends and Family: More than three times a week    Frequency of Social Gatherings with Friends and Family: More than three times  a week    Attends Religious Services: More than 4 times per year    Active Member of [employer] or Organizations: Yes    Attends Banker Meetings: More than 4 times per year    Marital Status: Divorced  Intimate Partner Violence: Not At Risk (07/31/2024)   Humiliation, Afraid, Rape, and Kick questionnaire    Fear of Current or Ex-Partner: No    Emotionally Abused: No    Physically Abused: No  Sexually Abused: No     Current Outpatient Medications:    Bacillus Coagulans-Inulin (ALIGN PREBIOTIC-PROBIOTIC) 5-1.25 MG-GM CHEW, , Disp: , Rfl:    BIOTIN PO, Take 2 each by mouth daily., Disp: , Rfl:    Cholecalciferol (VITAMIN D3 GUMMIES ADULT PO), Take 2 each by mouth daily., Disp: , Rfl:    fexofenadine (ALLEGRA) 180 MG tablet, Take 1 tablet by mouth daily., Disp: , Rfl:    levonorgestrel  (MIRENA ) 20 MCG/24HR IUD, 1 Intra Uterine Device (1 each total) by Intrauterine route once for 1 dose., Disp: 1 each, Rfl: 0   Multiple Vitamins-Minerals (MULTIVITAL) CHEW, Chew 1 tablet by mouth daily., Disp: , Rfl:   Allergies  Allergen Reactions   Latex    Penicillin G Rash     ROS  Constitutional: Negative for fever or weight change.  Respiratory: Negative for cough and shortness of breath.   Cardiovascular: Negative for chest pain or palpitations.  Gastrointestinal: Negative for abdominal pain, no bowel changes.  Musculoskeletal: Negative for gait problem or joint swelling.  Skin: Negative for rash.  Neurological: Negative for dizziness or headache.  No other specific complaints in a complete review of systems (except as listed in HPI above).   Objective  Vitals:   07/31/24 1036  BP: 118/76  Pulse: 79  Resp: 16  SpO2: 99%  Weight: 165 lb 14.4 oz (75.3 kg)  Height: 5' 4 (1.626 m)    Body mass index is 28.48 kg/m.  Physical Exam  Constitutional: Patient appears well-developed and well-nourished. No distress.  HENT: Head: Normocephalic and atraumatic. Ears: B TMs ok,  no erythema or effusion; Nose: Nose normal. Mouth/Throat: Oropharynx is clear and moist. No oropharyngeal exudate.  Eyes: Conjunctivae and EOM are normal. Pupils are equal, round, and reactive to light. No scleral icterus.  Neck: Normal range of motion. Neck supple. No JVD present. No thyromegaly present.  Cardiovascular: Normal rate, regular rhythm and normal heart sounds.  No murmur heard. No BLE edema. Pulmonary/Chest: Effort normal and breath sounds normal. No respiratory distress. Abdominal: Soft. Bowel sounds are normal, no distension. There is no tenderness. no masses Breast: no lumps or masses, no nipple discharge or rashes FEMALE GENITALIA:  Not done  RECTAL: not done  Musculoskeletal: Normal range of motion, no joint effusions. No gross deformities Neurological: he is alert and oriented to person, place, and time. No cranial nerve deficit. Coordination, balance, strength, speech and gait are normal.  Skin: Skin is warm and dry. No rash noted. No erythema.  Psychiatric: Patient has a normal mood and affect. behavior is normal. Judgment and thought content normal.     Assessment & Plan  1. Well adult exam (Primary)  - Hepatitis B Surface AntiBODY - Comprehensive metabolic panel with GFR - CBC with Differential/Platelet - Lipid panel - Hemoglobin A1c - MM 3D SCREENING MAMMOGRAM BILATERAL BREAST; Future - Iron, TIBC and Ferritin Panel  2. Lipid screening  - Lipid panel  3. Pre-diabetes  - Hemoglobin A1c  4. Need for hepatitis B screening test  - Hepatitis B Surface AntiBODY  5. Encounter for screening mammogram for malignant neoplasm of breast  - MM 3D SCREENING MAMMOGRAM BILATERAL BREAST; Future  6. Menorrhagia with irregular cycle  Check labs and go see gyn if continues   7. Perimenopause  Discussed the New Menopause  8. Colon cancer screening  - Cologuard   -USPSTF grade A and B recommendations reviewed with patient; age-appropriate recommendations,  preventive care, screening tests, etc discussed and encouraged; healthy  living encouraged; see AVS for patient education given to patient -Discussed importance of 150 minutes of physical activity weekly, eat two servings of fish weekly, eat one serving of tree nuts ( cashews, pistachios, pecans, almonds.SABRA) every other day, eat 6 servings of fruit/vegetables daily and drink plenty of water and avoid sweet beverages.   -Reviewed Health Maintenance: Yes.

## 2024-08-01 LAB — CBC WITH DIFFERENTIAL/PLATELET
Absolute Lymphocytes: 3149 {cells}/uL (ref 850–3900)
Absolute Monocytes: 374 {cells}/uL (ref 200–950)
Basophils Absolute: 26 {cells}/uL (ref 0–200)
Basophils Relative: 0.3 %
Eosinophils Absolute: 44 {cells}/uL (ref 15–500)
Eosinophils Relative: 0.5 %
HCT: 44.7 % (ref 35.0–45.0)
Hemoglobin: 14.2 g/dL (ref 11.7–15.5)
MCH: 26.5 pg — ABNORMAL LOW (ref 27.0–33.0)
MCHC: 31.8 g/dL — ABNORMAL LOW (ref 32.0–36.0)
MCV: 83.6 fL (ref 80.0–100.0)
MPV: 12 fL (ref 7.5–12.5)
Monocytes Relative: 4.3 %
Neutro Abs: 5107 {cells}/uL (ref 1500–7800)
Neutrophils Relative %: 58.7 %
Platelets: 274 Thousand/uL (ref 140–400)
RBC: 5.35 Million/uL — ABNORMAL HIGH (ref 3.80–5.10)
RDW: 13 % (ref 11.0–15.0)
Total Lymphocyte: 36.2 %
WBC: 8.7 Thousand/uL (ref 3.8–10.8)

## 2024-08-01 LAB — HIV ANTIBODY (ROUTINE TESTING W REFLEX)
HIV 1&2 Ab, 4th Generation: NONREACTIVE
HIV FINAL INTERPRETATION: NEGATIVE

## 2024-08-01 LAB — COMPREHENSIVE METABOLIC PANEL WITH GFR
AG Ratio: 1.7 (calc) (ref 1.0–2.5)
ALT: 15 U/L (ref 6–29)
AST: 17 U/L (ref 10–35)
Albumin: 4.8 g/dL (ref 3.6–5.1)
Alkaline phosphatase (APISO): 86 U/L (ref 37–153)
BUN: 13 mg/dL (ref 7–25)
CO2: 28 mmol/L (ref 20–32)
Calcium: 9.9 mg/dL (ref 8.6–10.4)
Chloride: 99 mmol/L (ref 98–110)
Creat: 0.89 mg/dL (ref 0.50–1.03)
Globulin: 2.9 g/dL (ref 1.9–3.7)
Glucose, Bld: 93 mg/dL (ref 65–99)
Potassium: 4 mmol/L (ref 3.5–5.3)
Sodium: 137 mmol/L (ref 135–146)
Total Bilirubin: 1.2 mg/dL (ref 0.2–1.2)
Total Protein: 7.7 g/dL (ref 6.1–8.1)
eGFR: 76 mL/min/1.73m2 (ref >=60)

## 2024-08-01 LAB — CERVICOVAGINAL ANCILLARY ONLY
Bacterial Vaginitis (gardnerella): NEGATIVE
Candida Glabrata: NEGATIVE
Candida Vaginitis: NEGATIVE
Chlamydia: NEGATIVE
Comment: NEGATIVE
Comment: NEGATIVE
Comment: NEGATIVE
Comment: NEGATIVE
Comment: NEGATIVE
Comment: NORMAL
Neisseria Gonorrhea: NEGATIVE
Trichomonas: NEGATIVE

## 2024-08-01 LAB — IRON,TIBC AND FERRITIN PANEL
%SAT: 39 % (ref 16–45)
Ferritin: 63 ng/mL (ref 16–232)
Iron: 146 ug/dL (ref 45–160)
TIBC: 373 ug/dL (ref 250–450)

## 2024-08-01 LAB — RPR: RPR Ser Ql: NONREACTIVE

## 2024-08-01 LAB — HEMOGLOBIN A1C
Hgb A1c MFr Bld: 5.7 % — ABNORMAL HIGH (ref <5.7)
Mean Plasma Glucose: 117 mg/dL
eAG (mmol/L): 6.5 mmol/L

## 2024-08-01 LAB — LIPID PANEL
Cholesterol: 238 mg/dL — ABNORMAL HIGH (ref <200)
HDL: 80 mg/dL (ref >=50)
LDL Cholesterol (Calc): 139 mg/dL — ABNORMAL HIGH
Non-HDL Cholesterol (Calc): 158 mg/dL — ABNORMAL HIGH (ref <130)
Total CHOL/HDL Ratio: 3 (calc) (ref <5.0)
Triglycerides: 88 mg/dL (ref <150)

## 2024-08-01 LAB — HEPATITIS B SURFACE ANTIBODY,QUALITATIVE: Hep B S Ab: NONREACTIVE

## 2024-08-02 ENCOUNTER — Ambulatory Visit: Payer: Self-pay | Admitting: Family Medicine

## 2024-12-05 ENCOUNTER — Encounter: Payer: Self-pay | Admitting: Family Medicine

## 2024-12-11 ENCOUNTER — Telehealth: Admitting: Physician Assistant

## 2024-12-11 DIAGNOSIS — R52 Pain, unspecified: Secondary | ICD-10-CM | POA: Diagnosis not present

## 2024-12-11 DIAGNOSIS — R6889 Other general symptoms and signs: Secondary | ICD-10-CM

## 2024-12-11 MED ORDER — FLUTICASONE PROPIONATE 50 MCG/ACT NA SUSP: 2.0000 | Freq: Every day | NASAL | 0 refills | Status: AC

## 2024-12-11 MED ORDER — OSELTAMIVIR PHOSPHATE 75 MG PO CAPS: 75.0000 mg | ORAL_CAPSULE | Freq: Two times a day (BID) | ORAL | 0 refills | Status: AC

## 2024-12-11 NOTE — Progress Notes (Signed)
 E visit for Flu like symptoms   We are sorry that you are not feeling well.  Here is how we plan to help! Based on what you have shared with me it looks like you may have a respiratory virus that may be influenza.  Influenza or "the flu" is  an infection caused by a respiratory virus. The flu virus is highly contagious and persons who did not receive their yearly flu vaccination may "catch" the flu from close contact.  We have anti-viral medications to treat the viruses that cause this infection. They are not a "cure" and only shorten the course of the infection. These prescriptions are most effective when they are given within the first 2 days of "flu" symptoms. Antiviral medications are indicated if you have a high risk of complications from the flu. You should  also consider an antiviral medication if you are in close contact with someone who is at risk. These medications can help patients avoid complications from the flu but have side effects that you should know.   Possible side effects from Tamiflu  or oseltamivir  include nausea, vomiting, diarrhea, dizziness, headaches, eye redness, sleep problems or other respiratory symptoms. You should not take Tamiflu  if you have an allergy to oseltamivir  or any to the ingredients in Tamiflu .  Based upon your symptoms and potential risk factors I have prescribed Oseltamivir  (Tamiflu ).  It has been sent to your designated pharmacy.  You will take one 75 mg capsule orally twice a day for the next 5 days.   For nasal congestion, you may use an oral decongestant such as Mucinex D or if you have glaucoma or high blood pressure use plain Mucinex.  Saline nasal spray or nasal drops can help and can safely be used as often as needed for congestion.  If you have a sore or scratchy throat, use a saltwater gargle-  to  teaspoon of salt dissolved in a 4-ounce to 8-ounce glass of warm water .  Gargle the solution for approximately 15-30 seconds and then spit.  It is  important not to swallow the solution.  You can also use throat lozenges/cough drops and Chloraseptic spray to help with throat pain or discomfort.  Warm or cold liquids can also be helpful in relieving throat pain.  For headache, pain or general discomfort, you can use Ibuprofen or Tylenol  as directed.   Some authorities believe that zinc sprays or the use of Echinacea may shorten the course of your symptoms.  I have prescribed the following medications to help lessen symptoms: I have prescribed Fluticasone  nasal spray 2 sprays in each nostril one time per dayasal spray 2 sprays in each nostril one time per day  You are to isolate at home until you have been fever-free for at least 24 hours without a fever-reducing medication, and symptoms have been steadily improving for 24 hours.  If you must be around other household members who do not have symptoms, you need to make sure that both you and the family members are masking consistently with a high-quality mask.  If you note any worsening of symptoms despite treatment, please seek an in-person evaluation ASAP. If you note any significant shortness of breath or any chest pain, please seek ED evaluation. Please do not delay care!  ANYONE WHO HAS FLU SYMPTOMS SHOULD: Stay home. The flu is highly contagious and going out or to work exposes others! Be sure to drink plenty of fluids. Water  is fine as well as fruit juices, sodas and electrolyte  beverages. You may want to stay away from caffeine or alcohol. If you are nauseated, try taking small sips of liquids. How do you know if you are getting enough fluid? Your urine should be a pale yellow or almost colorless. Get rest. Taking a steamy shower or using a humidifier may help nasal congestion and ease sore throat pain. Using a saline nasal spray works much the same way. Cough drops, hard candies and sore throat lozenges may ease your cough. Line up a caregiver. Have someone check on you regularly.  GET  HELP RIGHT AWAY IF: You cannot keep down liquids or your medications. You become short of breath Your fell like you are going to pass out or loose consciousness. Your symptoms persist after you have completed your treatment plan  MAKE SURE YOU  Understand these instructions. Will watch your condition. Will get help right away if you are not doing well or get worse.  Your e-visit answers were reviewed by a board certified advanced clinical practitioner to complete your personal care plan.  Depending on the condition, your plan could have included both over the counter or prescription medications.  If there is a problem please reply  once you have received a response from your provider.  Your safety is important to us .  If you have drug allergies check your prescription carefully.    You can use MyChart to ask questions about today's visit, request a non-urgent call back, or ask for a work or school excuse for 24 hours related to this e-Visit. If it has been greater than 24 hours you will need to follow up with your provider, or enter a new e-Visit to address those concerns.  You will get an e-mail in the next two days asking about your experience.  I hope that your e-visit has been valuable and will speed your recovery. Thank you for using e-visits.   I have spent 5 minutes in review of e-visit questionnaire, review and updating patient chart, medical decision making and response to patient.   Elsie Velma Lunger, PA-C

## 2024-12-19 ENCOUNTER — Encounter

## 2025-01-01 ENCOUNTER — Encounter: Admitting: Advanced Practice Midwife

## 2025-01-02 ENCOUNTER — Encounter
